# Patient Record
Sex: Female | Born: 1950 | Race: White | Hispanic: No | Marital: Married | Smoking: Never smoker
Health system: Southern US, Community
[De-identification: ages and names within clinical notes are randomized; demographics above are authoritative.]

## PROBLEM LIST (undated history)

## (undated) DIAGNOSIS — I10 Essential (primary) hypertension: Secondary | ICD-10-CM

## (undated) HISTORY — PX: OTHER SURGICAL HISTORY: SHX169

---

## 2007-06-05 ENCOUNTER — Encounter (HOSPITAL_COMMUNITY): Payer: Self-pay | Admitting: Obstetrics and Gynecology

## 2007-06-05 ENCOUNTER — Ambulatory Visit (HOSPITAL_COMMUNITY): Admission: RE | Admit: 2007-06-05 | Discharge: 2007-06-05 | Payer: Self-pay | Admitting: Obstetrics and Gynecology

## 2007-06-18 ENCOUNTER — Encounter: Payer: Self-pay | Admitting: Internal Medicine

## 2007-08-04 ENCOUNTER — Ambulatory Visit: Payer: Self-pay | Admitting: Family Medicine

## 2007-08-04 ENCOUNTER — Encounter (INDEPENDENT_AMBULATORY_CARE_PROVIDER_SITE_OTHER): Payer: Self-pay | Admitting: Family Medicine

## 2007-08-04 DIAGNOSIS — N951 Menopausal and female climacteric states: Secondary | ICD-10-CM

## 2007-08-04 DIAGNOSIS — M549 Dorsalgia, unspecified: Secondary | ICD-10-CM | POA: Insufficient documentation

## 2007-08-04 DIAGNOSIS — J309 Allergic rhinitis, unspecified: Secondary | ICD-10-CM | POA: Insufficient documentation

## 2007-08-04 DIAGNOSIS — Z9889 Other specified postprocedural states: Secondary | ICD-10-CM

## 2007-08-04 LAB — CONVERTED CEMR LAB
Bilirubin Urine: NEGATIVE
Glucose, Urine, Semiquant: NEGATIVE
Ketones, urine, test strip: NEGATIVE
Protein, U semiquant: NEGATIVE
Urobilinogen, UA: NEGATIVE

## 2007-08-07 ENCOUNTER — Telehealth (INDEPENDENT_AMBULATORY_CARE_PROVIDER_SITE_OTHER): Payer: Self-pay | Admitting: *Deleted

## 2007-11-11 ENCOUNTER — Encounter: Admission: RE | Admit: 2007-11-11 | Discharge: 2007-11-11 | Payer: Self-pay | Admitting: Obstetrics and Gynecology

## 2007-11-27 ENCOUNTER — Ambulatory Visit: Payer: Self-pay | Admitting: Internal Medicine

## 2007-11-27 DIAGNOSIS — B029 Zoster without complications: Secondary | ICD-10-CM | POA: Insufficient documentation

## 2008-01-11 ENCOUNTER — Ambulatory Visit: Payer: Self-pay | Admitting: Internal Medicine

## 2008-01-11 ENCOUNTER — Telehealth (INDEPENDENT_AMBULATORY_CARE_PROVIDER_SITE_OTHER): Payer: Self-pay | Admitting: *Deleted

## 2008-01-11 DIAGNOSIS — R21 Rash and other nonspecific skin eruption: Secondary | ICD-10-CM

## 2008-01-11 DIAGNOSIS — L509 Urticaria, unspecified: Secondary | ICD-10-CM | POA: Insufficient documentation

## 2008-06-29 ENCOUNTER — Ambulatory Visit: Payer: Self-pay | Admitting: Family Medicine

## 2008-06-29 DIAGNOSIS — N390 Urinary tract infection, site not specified: Secondary | ICD-10-CM

## 2008-06-29 LAB — CONVERTED CEMR LAB
Nitrite: NEGATIVE
Urobilinogen, UA: 0.2
pH: 6.5

## 2008-06-30 ENCOUNTER — Encounter: Payer: Self-pay | Admitting: Family Medicine

## 2008-11-15 ENCOUNTER — Encounter: Admission: RE | Admit: 2008-11-15 | Discharge: 2008-11-15 | Payer: Self-pay | Admitting: Obstetrics and Gynecology

## 2009-11-16 ENCOUNTER — Encounter: Admission: RE | Admit: 2009-11-16 | Discharge: 2009-11-16 | Payer: Self-pay | Admitting: Obstetrics and Gynecology

## 2010-10-15 ENCOUNTER — Other Ambulatory Visit (HOSPITAL_COMMUNITY): Payer: Self-pay | Admitting: Obstetrics and Gynecology

## 2010-10-15 DIAGNOSIS — Z1231 Encounter for screening mammogram for malignant neoplasm of breast: Secondary | ICD-10-CM

## 2010-11-19 ENCOUNTER — Ambulatory Visit
Admission: RE | Admit: 2010-11-19 | Discharge: 2010-11-19 | Disposition: A | Discharge status: Home / Self Care | Payer: BC Managed Care – PPO | Source: Ambulatory Visit | Attending: Obstetrics and Gynecology | Admitting: Obstetrics and Gynecology

## 2010-11-19 DIAGNOSIS — Z1231 Encounter for screening mammogram for malignant neoplasm of breast: Secondary | ICD-10-CM

## 2011-01-08 NOTE — Op Note (Signed)
NAMEOHANNA, GASSERT                  ACCOUNT NO.:  [id number]   MEDICAL RECORD NO.:  [id number]          PATIENT TYPE:  AMB   LOCATION:  SDC                           FACILITY:  [facility]   PHYSICIAN:  Zelphia Cairo, MD    DATE OF BIRTH:  1950-10-09   DATE OF PROCEDURE:  06/05/2007  DATE OF DISCHARGE:                               OPERATIVE REPORT   PREOPERATIVE DIAGNOSIS:  Endometrial polyp.   POSTOPERATIVE DIAGNOSIS:  Endometrial polyp.   OPERATION/PROCEDURE:  1. Cervical block.  2. Hysteroscopy.  3. Polypectomy   SURGEON:  Zelphia Cairo, M.D.   ANESTHESIA:  General.   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   SPECIMEN:  Endometrial polyp to pathology.   FLUIDS DEFICIT:  Fluid deficit of 50 mL.   ESTIMATED BLOOD LOSS:  Minimal.   COMPLICATIONS:  None.   CONDITION:  Stable and extubated to recovery room.   DESCRIPTION OF PROCEDURE:  The patient was taken to the operating room  where general anesthesia was obtained.  She was placed in the dorsal  lithotomy position using Allen stirrups.  She was prepped and draped in  sterile fashion.  An in-and-out catheter was used to drain her bladder  for approximately 15 mL of clear urine.  A bivalve speculum was then  placed in the vagina and a single-tooth tenaculum on the anterior lip of  the cervix.  The hysteroscope was then inserted through the cervix under  direct visualization.  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  Endometrial polyp was noted near the ostia of the right horn.  Polyp forceps were used to grasp and remove the endometrial polyp at the  base.  This was sent to pathology.  The hysteroscope was then removed.  Single-tooth tenaculum was removed from the cervix.  Cervical block was  provided with 1% lidocaine.  Speculum was removed and the patient was  extubated, taken to recovery room in stable  condition,      Zelphia Cairo, MD  Electronically Signed     GA/MEDQ  D:  06/05/2007  T:  06/06/2007  Job:  [id number]

## 2011-01-08 NOTE — H&P (Signed)
NAME:  Virginia [name], Virginia [name]                  ACCOUNT NO.:  [id number]   MEDICAL RECORD NO.:  [id number]          PATIENT TYPE:  AMB   LOCATION:                                FACILITY:  [facility]   PHYSICIAN:  Zelphia Cairo, MD    DATE OF BIRTH:  03-20-1951   DATE OF ADMISSION:  DATE OF DISCHARGE:                              HISTORY & PHYSICAL   A 55-year white female who presented in referral for abnormal Pap smear.  Pap smear done April 12, 2007 showed endometrial cells with no mention  of atypia or dysplasia.  The patient denies any symptoms of  postmenopausal bleeding or pelvic pain.   PAST MEDICAL HISTORY:  Arthritis, seasonal allergies.   SURGICAL HISTORY:  Benign vulvar biopsy 2001.   SOCIAL HISTORY:  Negative for tobacco use.   ALLERGIES:  SULFA.   MEDICATIONS:  Prempro 0.3 mg per day.   OBSTETRIC HISTORY:  Two spontaneous vaginal deliveries.   GYNECOLOGICAL HISTORY:  Negative for abnormal Pap smears.  Her last  menstrual period was 11 years ago.  She denies a history of any  postmenopausal bleeding.   FAMILY HISTORY:  Significant for father with skin cancer; otherwise,  noncontributory.   PHYSICAL EXAMINATION:  VITAL SIGNS:  Height 5 feet 4 inches, weight 184,  blood pressure 120/80.  UA negative.  HEAD AND NECK:  Normal.  No thyromegaly or nodularity.  HEART:  Regular rate and rhythm.  LUNGS:  Clear bilaterally.  ABDOMEN:  Soft, nontender, nondistended.  PELVIC:  Shows normal external female genitalia, __________ vagina and  cervix are normal.  No lesions.  Endometrial biopsy was performed  showing benign endometrial polyp and proliferative endometrium.   ASSESSMENT:  A 60 year old white female with endometrial polyp.   PLAN:  For hysteroscopy, D&C with resection of endometrial polyp.      Zelphia Cairo, MD  Electronically Signed     GA/MEDQ  D:  06/04/2007  T:  06/05/2007  Job:  [id number]

## 2011-06-06 LAB — CBC
Hemoglobin: 15.1 — ABNORMAL HIGH
MCV: 91.1
RDW: 12.7

## 2015-03-02 ENCOUNTER — Other Ambulatory Visit: Payer: Self-pay | Admitting: Obstetrics and Gynecology

## 2015-03-03 LAB — CYTOLOGY - PAP

## 2018-07-03 DIAGNOSIS — R69 Illness, unspecified: Secondary | ICD-10-CM | POA: Diagnosis not present

## 2018-08-24 DIAGNOSIS — Z6830 Body mass index (BMI) 30.0-30.9, adult: Secondary | ICD-10-CM | POA: Diagnosis not present

## 2018-08-24 DIAGNOSIS — R419 Unspecified symptoms and signs involving cognitive functions and awareness: Secondary | ICD-10-CM | POA: Diagnosis not present

## 2019-01-07 DIAGNOSIS — R69 Illness, unspecified: Secondary | ICD-10-CM | POA: Diagnosis not present

## 2019-01-11 DIAGNOSIS — R69 Illness, unspecified: Secondary | ICD-10-CM | POA: Diagnosis not present

## 2019-02-16 DIAGNOSIS — D224 Melanocytic nevi of scalp and neck: Secondary | ICD-10-CM | POA: Diagnosis not present

## 2019-02-16 DIAGNOSIS — B078 Other viral warts: Secondary | ICD-10-CM | POA: Diagnosis not present

## 2019-02-16 DIAGNOSIS — B079 Viral wart, unspecified: Secondary | ICD-10-CM | POA: Diagnosis not present

## 2019-04-16 DIAGNOSIS — R69 Illness, unspecified: Secondary | ICD-10-CM | POA: Diagnosis not present

## 2019-05-04 DIAGNOSIS — R69 Illness, unspecified: Secondary | ICD-10-CM | POA: Diagnosis not present

## 2019-07-13 DIAGNOSIS — R69 Illness, unspecified: Secondary | ICD-10-CM | POA: Diagnosis not present

## 2019-07-15 DIAGNOSIS — Z1231 Encounter for screening mammogram for malignant neoplasm of breast: Secondary | ICD-10-CM | POA: Diagnosis not present

## 2019-07-15 DIAGNOSIS — Z01419 Encounter for gynecological examination (general) (routine) without abnormal findings: Secondary | ICD-10-CM | POA: Diagnosis not present

## 2019-07-15 DIAGNOSIS — Z6829 Body mass index (BMI) 29.0-29.9, adult: Secondary | ICD-10-CM | POA: Diagnosis not present

## 2019-09-23 ENCOUNTER — Ambulatory Visit: Payer: Self-pay

## 2019-09-30 ENCOUNTER — Ambulatory Visit: Payer: Medicare HMO | Attending: Internal Medicine

## 2019-09-30 DIAGNOSIS — Z23 Encounter for immunization: Secondary | ICD-10-CM | POA: Insufficient documentation

## 2019-09-30 NOTE — Progress Notes (Signed)
   Covid-19 Vaccination Clinic  Name:  Virginia [name]    MRN: [id number] DOB: 11-21-1950  09/30/2019  [name] was observed post Covid-19 immunization for 15 minutes without incidence. She was provided with Vaccine Information Sheet and instruction to access the V-Safe system.   [name] was instructed to call 911 with any severe reactions post vaccine: Marland Kitchen Difficulty breathing  . Swelling of your face and throat  . A fast heartbeat  . A bad rash all over your body  . Dizziness and weakness    Immunizations Administered    Name Date Dose VIS Date Route   Pfizer COVID-19 Vaccine 09/30/2019 11:39 AM 0.3 mL 08/06/2019 Intramuscular   Manufacturer: [place]   Lot: [id number]   [place]: [id number]

## 2019-10-14 ENCOUNTER — Ambulatory Visit: Payer: Self-pay

## 2019-10-25 ENCOUNTER — Ambulatory Visit: Payer: Medicare HMO | Attending: Internal Medicine

## 2019-10-25 DIAGNOSIS — Z23 Encounter for immunization: Secondary | ICD-10-CM | POA: Insufficient documentation

## 2019-10-25 NOTE — Progress Notes (Signed)
   Covid-19 Vaccination Clinic  Name:  Virginia [name]    MRN: [id number] DOB: [birth year]  10/25/2019  Virginia [name] was observed post Covid-19 immunization for 15 minutes without incidence. She was provided with Vaccine Information Sheet and instruction to access the V-Safe system.   Virginia [name] was instructed to call 911 with any severe reactions post vaccine: Marland Kitchen Difficulty breathing  . Swelling of your face and throat  . A fast heartbeat  . A bad rash all over your body  . Dizziness and weakness    Immunizations Administered    Name Date Dose VIS Date Route   Pfizer COVID-19 Vaccine 10/25/2019  3:18 PM 0.3 mL 08/06/2019 Intramuscular   Manufacturer: [place]   Lot: [id number]   [place]: [id number]

## 2019-11-10 DIAGNOSIS — R69 Illness, unspecified: Secondary | ICD-10-CM | POA: Diagnosis not present

## 2019-11-25 DIAGNOSIS — R69 Illness, unspecified: Secondary | ICD-10-CM | POA: Diagnosis not present

## 2019-12-22 DIAGNOSIS — Z961 Presence of intraocular lens: Secondary | ICD-10-CM | POA: Diagnosis not present

## 2019-12-22 DIAGNOSIS — H43813 Vitreous degeneration, bilateral: Secondary | ICD-10-CM | POA: Diagnosis not present

## 2019-12-22 DIAGNOSIS — H1045 Other chronic allergic conjunctivitis: Secondary | ICD-10-CM | POA: Diagnosis not present

## 2019-12-22 DIAGNOSIS — H04123 Dry eye syndrome of bilateral lacrimal glands: Secondary | ICD-10-CM | POA: Diagnosis not present

## 2020-05-11 DIAGNOSIS — R69 Illness, unspecified: Secondary | ICD-10-CM | POA: Diagnosis not present

## 2020-07-18 DIAGNOSIS — R69 Illness, unspecified: Secondary | ICD-10-CM | POA: Diagnosis not present

## 2020-08-09 DIAGNOSIS — Z124 Encounter for screening for malignant neoplasm of cervix: Secondary | ICD-10-CM | POA: Diagnosis not present

## 2020-08-09 DIAGNOSIS — Z1231 Encounter for screening mammogram for malignant neoplasm of breast: Secondary | ICD-10-CM | POA: Diagnosis not present

## 2020-08-09 DIAGNOSIS — N952 Postmenopausal atrophic vaginitis: Secondary | ICD-10-CM | POA: Diagnosis not present

## 2020-08-09 DIAGNOSIS — Z6829 Body mass index (BMI) 29.0-29.9, adult: Secondary | ICD-10-CM | POA: Diagnosis not present

## 2020-09-28 DIAGNOSIS — N390 Urinary tract infection, site not specified: Secondary | ICD-10-CM | POA: Diagnosis not present

## 2020-09-28 DIAGNOSIS — R309 Painful micturition, unspecified: Secondary | ICD-10-CM | POA: Diagnosis not present

## 2021-04-24 DIAGNOSIS — H6980 Other specified disorders of Eustachian tube, unspecified ear: Secondary | ICD-10-CM | POA: Diagnosis not present

## 2021-04-24 DIAGNOSIS — Z713 Dietary counseling and surveillance: Secondary | ICD-10-CM | POA: Diagnosis not present

## 2021-04-24 DIAGNOSIS — I10 Essential (primary) hypertension: Secondary | ICD-10-CM | POA: Diagnosis not present

## 2021-04-24 DIAGNOSIS — R300 Dysuria: Secondary | ICD-10-CM | POA: Diagnosis not present

## 2021-06-22 ENCOUNTER — Emergency Department (HOSPITAL_BASED_OUTPATIENT_CLINIC_OR_DEPARTMENT_OTHER): Payer: Medicare HMO

## 2021-06-22 ENCOUNTER — Emergency Department (HOSPITAL_BASED_OUTPATIENT_CLINIC_OR_DEPARTMENT_OTHER)
Admission: EM | Admit: 2021-06-22 | Discharge: 2021-06-22 | Disposition: A | Discharge status: Home / Self Care | Payer: Medicare HMO | Attending: Emergency Medicine | Admitting: Emergency Medicine

## 2021-06-22 ENCOUNTER — Encounter (HOSPITAL_BASED_OUTPATIENT_CLINIC_OR_DEPARTMENT_OTHER): Payer: Self-pay

## 2021-06-22 ENCOUNTER — Other Ambulatory Visit: Payer: Self-pay

## 2021-06-22 ENCOUNTER — Emergency Department (HOSPITAL_BASED_OUTPATIENT_CLINIC_OR_DEPARTMENT_OTHER): Payer: Medicare HMO | Admitting: Radiology

## 2021-06-22 DIAGNOSIS — R079 Chest pain, unspecified: Secondary | ICD-10-CM | POA: Diagnosis not present

## 2021-06-22 DIAGNOSIS — Z79899 Other long term (current) drug therapy: Secondary | ICD-10-CM | POA: Insufficient documentation

## 2021-06-22 DIAGNOSIS — I10 Essential (primary) hypertension: Secondary | ICD-10-CM | POA: Insufficient documentation

## 2021-06-22 DIAGNOSIS — R0789 Other chest pain: Secondary | ICD-10-CM | POA: Diagnosis not present

## 2021-06-22 DIAGNOSIS — R002 Palpitations: Secondary | ICD-10-CM | POA: Diagnosis not present

## 2021-06-22 DIAGNOSIS — R0602 Shortness of breath: Secondary | ICD-10-CM | POA: Diagnosis not present

## 2021-06-22 DIAGNOSIS — R55 Syncope and collapse: Secondary | ICD-10-CM | POA: Diagnosis not present

## 2021-06-22 DIAGNOSIS — R072 Precordial pain: Secondary | ICD-10-CM | POA: Diagnosis not present

## 2021-06-22 HISTORY — DX: Essential (primary) hypertension: I10

## 2021-06-22 LAB — CBC
HCT: 48.6 % — ABNORMAL HIGH (ref 36.0–46.0)
Hemoglobin: 16.4 g/dL — ABNORMAL HIGH (ref 12.0–15.0)
MCH: 31.5 pg (ref 26.0–34.0)
MCHC: 33.7 g/dL (ref 30.0–36.0)
MCV: 93.3 fL (ref 80.0–100.0)
Platelets: 343 10*3/uL (ref 150–400)
RBC: 5.21 MIL/uL — ABNORMAL HIGH (ref 3.87–5.11)
RDW: 12.5 % (ref 11.5–15.5)
WBC: 12.4 10*3/uL — ABNORMAL HIGH (ref 4.0–10.5)
nRBC: 0 % (ref 0.0–0.2)

## 2021-06-22 LAB — BASIC METABOLIC PANEL
Anion gap: 10 (ref 5–15)
BUN: 14 mg/dL (ref 8–23)
CO2: 25 mmol/L (ref 22–32)
Calcium: 9.7 mg/dL (ref 8.9–10.3)
Chloride: 104 mmol/L (ref 98–111)
Creatinine, Ser: 0.92 mg/dL (ref 0.44–1.00)
GFR, Estimated: >60 mL/min (ref >60)
Glucose, Bld: 101 mg/dL — ABNORMAL HIGH (ref 70–99)
Potassium: 3.9 mmol/L (ref 3.5–5.1)
Sodium: 139 mmol/L (ref 135–145)

## 2021-06-22 LAB — TROPONIN I (HIGH SENSITIVITY)
Troponin I (High Sensitivity): 7 ng/L (ref <18)
Troponin I (High Sensitivity): 11 ng/L (ref <18)

## 2021-06-22 LAB — URINALYSIS, ROUTINE W REFLEX MICROSCOPIC
Bilirubin Urine: NEGATIVE
Glucose, UA: NEGATIVE mg/dL
Ketones, ur: 15 mg/dL — AB
Nitrite: NEGATIVE
Protein, ur: NEGATIVE mg/dL
Specific Gravity, Urine: 1.012 (ref 1.005–1.030)
pH: 5.5 (ref 5.0–8.0)

## 2021-06-22 MED ORDER — IOHEXOL 350 MG/ML SOLN
100.0000 mL | Freq: Once | INTRAVENOUS | Status: AC | PRN
  Administered 2021-06-22: 63 mL via INTRAVENOUS

## 2021-06-22 NOTE — ED Notes (Signed)
Patient verbalizes understanding of discharge instructions. Opportunity for questioning and answers were provided. Patient discharged from ED.  °

## 2021-06-22 NOTE — ED Notes (Signed)
Patient transported to X-ray 

## 2021-06-22 NOTE — ED Provider Notes (Signed)
Pt signed out by Dr. Rogene Houston pending 2nd trop and CT chest.  CT Chest:    IMPRESSION:  No evidence of acute pulmonary embolism or other acute abnormality.    2nd trop nl.    Pt feels well.  HR down to the 80s.   Pt is stable for d/c.  Return if worse.  F/u with pcp.     Isla Pence, MD 06/22/21 [phone]

## 2021-06-22 NOTE — ED Triage Notes (Signed)
Pt reports acute onset of feeling "like I was going to pass out", heart "pressure", hot flashes, SOB, and her heart "pounding". Symptoms started approx 2 hours ago after taking a [employer] class. Pt takes [employer] class 3 times a week. Pt denies N/V

## 2021-06-22 NOTE — Discharge Instructions (Signed)
Return for any recurrent palpitations lasting 40 minutes or longer.  Return for any passing out.  Make an appointment to follow-up with cardiology for further evaluation and also make an appointment to follow back up with your primary care doctor.  Today's labs without significant abnormalities.  Cardiac markers normal.  Chest x-ray normal.  As we discussed since she had COVID in August it is possible there could be some residual inflammation causing the arrhythmia.

## 2021-06-22 NOTE — ED Provider Notes (Addendum)
[facility]ENC[facility] DEPT Provider Note   CSN: [id number] Arrival date & time: 06/22/21  1105     History Chief Complaint  Patient presents with   Weakness   Near Syncope    Virginia [name] is a 70 y.o. female.  Patient had 20 to 30-minute onset after returning from doing deep water swim class.  Which she felt fine.  Started 2 hours after that.  Patient had sudden onset of feeling of palpitations thought she was going to pass out had some heart pressure and hot flashes and some shortness of breath.  All symptoms have resolved now.  Patient is never really had anything like this happen before.  Has medical history significant for hypertension.      Past Medical History:  Diagnosis Date   Hypertension     Patient Active Problem List   Diagnosis Date Noted   UTI 06/29/2008   URTICARIA 01/11/2008   RASH-NONVESICULAR 01/11/2008   HERPES ZOSTER 11/27/2007   ALLERGIC RHINITIS 08/04/2007   POSTMENOPAUSAL STATUS 08/04/2007   BACK PAIN, ACUTE 08/04/2007   HYSTEROSCOPY, HX OF 08/04/2007    Past Surgical History:  Procedure Laterality Date   uterine polyectomy       OB History   No obstetric history on file.     History reviewed. No pertinent family history.  Social History   Tobacco Use   Smoking status: Never   Smokeless tobacco: Never  Vaping Use   Vaping Use: Never used  Substance Use Topics   Alcohol use: Never   Drug use: Never    Home Medications Prior to Admission medications   Medication Sig Start Date End Date Taking? Authorizing Provider  hydrochlorothiazide (HYDRODIURIL) 12.5 MG tablet Take 12.5 mg by mouth as needed (Every other Day).   Yes [provider]    Allergies    Sulfonamide derivatives  Review of Systems   Review of Systems  Constitutional:  Negative for chills and fever.  HENT:  Negative for ear pain and sore throat.   Eyes:  Negative for pain and visual disturbance.  Respiratory:  Positive for shortness of  breath. Negative for cough.   Cardiovascular:  Positive for chest pain and palpitations.  Gastrointestinal:  Negative for abdominal pain and vomiting.  Genitourinary:  Negative for dysuria and hematuria.  Musculoskeletal:  Negative for arthralgias and back pain.  Skin:  Negative for color change and rash.  Neurological:  Positive for light-headedness. Negative for seizures and syncope.  All other systems reviewed and are negative.  Physical Exam Updated Vital Signs BP 109/78 (BP Location: Left Arm)   Pulse (!) 106   Temp 97.6 F (36.4 C)   Resp 18   Ht 1.638 m (5' 4.5")   Wt 76.7 kg   SpO2 99%   BMI 28.56 kg/m   Physical Exam Vitals and nursing note reviewed.  Constitutional:      General: She is not in acute distress.    Appearance: Normal appearance. She is well-developed.  HENT:     Head: Normocephalic and atraumatic.  Eyes:     Extraocular Movements: Extraocular movements intact.     Conjunctiva/sclera: Conjunctivae normal.     Pupils: Pupils are equal, round, and reactive to light.  Cardiovascular:     Rate and Rhythm: Normal rate and regular rhythm.     Heart sounds: No murmur heard. Pulmonary:     Effort: Pulmonary effort is normal. No respiratory distress.     Breath sounds: Normal breath sounds.  No wheezing.  Abdominal:     Palpations: Abdomen is soft.     Tenderness: There is no abdominal tenderness.  Musculoskeletal:        General: No swelling. Normal range of motion.     Cervical back: Neck supple.  Skin:    General: Skin is warm and dry.  Neurological:     General: No focal deficit present.     Mental Status: She is alert and oriented to person, place, and time.    ED Results / Procedures / Treatments   Labs (all labs ordered are listed, but only abnormal results are displayed) Labs Reviewed  BASIC METABOLIC PANEL - Abnormal; Notable for the following components:      Result Value   Glucose, Bld 101 (*)    All other components within normal  limits  CBC - Abnormal; Notable for the following components:   WBC 12.4 (*)    RBC 5.21 (*)    Hemoglobin 16.4 (*)    HCT 48.6 (*)    All other components within normal limits  URINALYSIS, ROUTINE W REFLEX MICROSCOPIC - Abnormal; Notable for the following components:   APPearance HAZY (*)    Hgb urine dipstick MODERATE (*)    Ketones, ur 15 (*)    Leukocytes,Ua TRACE (*)    All other components within normal limits  CBG MONITORING, ED  TROPONIN I (HIGH SENSITIVITY)  TROPONIN I (HIGH SENSITIVITY)    EKG EKG Interpretation  Date/Time:  Friday June 22 2021 11:35:23 EDT Ventricular Rate:  137 PR Interval:  140 QRS Duration: 68 QT Interval:  276 QTC Calculation: 416 R Axis:   45 Text Interpretation: Sinus tachycardia Anterior infarct , age undetermined Abnormal ECG No previous ECGs available Confirmed by Fredia Sorrow [phone]) on 06/22/2021 1:20:06 PM  Radiology DG Chest 2 View  Result Date: 06/22/2021 CLINICAL DATA:  Chest pain EXAM: CHEST - 2 VIEW COMPARISON:  None. FINDINGS: The heart size and mediastinal contours are within normal limits. Both lungs are clear. The visualized skeletal structures are unremarkable. IMPRESSION: No active cardiopulmonary disease. Electronically Signed   By: Keane Police D.O.   On: 06/22/2021 12:32    Procedures Procedures   Medications Ordered in ED Medications - No data to display  ED Course  I have reviewed the triage vital signs and the nursing notes.  Pertinent labs & imaging results that were available during my care of the patient were reviewed by me and considered in my medical decision making (see chart for details).    MDM Rules/Calculators/A&P                          Patient has been persistently tachycardic here around 100.  Oxygen sats have been 97% respiration not up.  Patient without fever.  Patient did have [place] in August.  Raising some concerns may be for pulmonary embolus.  Cardiac monitoring here without  arrhythmia.  EKG just sort of a sinus tach.  Chest x-ray negative.  Basic metabolic panel normal.  First troponin normal at 7.  Mild leukocytosis with white blood cell count 12.4.  Hemoglobin a little concentrated at 16.4.  Urinalysis not consistent with urinary tract infection.  Chest x-ray negative.  Based on patient's symptoms.  Since we are not seeing anything abnormal second troponin is pending.  We will go ahead and do CT angio chest to rule out pulmonary embolus.  Patient does not have a dye allergy  CT  angio chest negative.  The patient will follow-up for the palpitations and near syncope.  Would recommend cardiology follow-up as well as primary care doctor follow-up.  Second heart marker without significant change.  It was 11.  First 1 was 7.  CT angio chest pending.  Final Clinical Impression(s) / ED Diagnoses Final diagnoses:  Palpitations  Near syncope  Precordial pain    Rx / DC Orders ED Discharge Orders     None        Fredia Sorrow, MD 06/22/21 1428    Fredia Sorrow, MD 06/22/21 1453    Fredia Sorrow, MD 06/22/21 1454

## 2021-06-28 DIAGNOSIS — I10 Essential (primary) hypertension: Secondary | ICD-10-CM | POA: Diagnosis not present

## 2021-06-28 DIAGNOSIS — R002 Palpitations: Secondary | ICD-10-CM | POA: Diagnosis not present

## 2021-07-04 ENCOUNTER — Encounter: Payer: Self-pay | Admitting: Cardiovascular Disease

## 2021-07-04 ENCOUNTER — Ambulatory Visit (INDEPENDENT_AMBULATORY_CARE_PROVIDER_SITE_OTHER): Payer: Medicare HMO

## 2021-07-04 ENCOUNTER — Other Ambulatory Visit: Payer: Self-pay

## 2021-07-04 ENCOUNTER — Ambulatory Visit: Payer: Medicare HMO | Admitting: Cardiovascular Disease

## 2021-07-04 VITALS — BP 158/89 | HR 89 | Ht 64.0 in | Wt 169.0 lb

## 2021-07-04 DIAGNOSIS — R42 Dizziness and giddiness: Secondary | ICD-10-CM

## 2021-07-04 DIAGNOSIS — R002 Palpitations: Secondary | ICD-10-CM | POA: Diagnosis not present

## 2021-07-04 DIAGNOSIS — R55 Syncope and collapse: Secondary | ICD-10-CM

## 2021-07-04 DIAGNOSIS — U071 COVID-19: Secondary | ICD-10-CM | POA: Diagnosis not present

## 2021-07-04 MED ORDER — METOPROLOL TARTRATE 25 MG PO TABS: ORAL_TABLET | ORAL | 6 refills | Status: AC

## 2021-07-04 NOTE — Progress Notes (Unsigned)
Enrolled patient for a 14 day Zio XT  monitor to be mailed to patients home  °

## 2021-07-04 NOTE — Progress Notes (Signed)
Cardiology Office Note    Date:  07/15/2021   ID:  Logan, Vegh July 23, 1951, MRN [id number]  PCP:  Hendricks Limes, MD  Cardiologist:  Shelva Majestic, MD   New cardiology evaluation, referred to the courtesy of Dr. Lorriane Shire and [facility]   History of Present Illness:  Virginia [name] is a 70 y.o. female who was recently evaluated at [facility] emergency department after experiencing a presyncopal episode shortly after doing deep water aerobics.  Mrs. Beeck denies any known prior cardiac history.  She tested positive for COVID on August 14 and had symptoms of sinus pressure, sore throat, fever and cough.  She tested negative on August 27.  She had travel to [place] in [place] from September 14 through the 29th and did well with travel and was asymptomatic.  She denies any history of exertional chest pain or change in exercise tolerance.  She exercises regularly and does deep water aerobics at least 3 days/week for 60 minutes without difficulty.  On October 28 approximately 20 to 30 minutes after returning from her swim class, she had sudden onset of a feeling of palpitations and thought she was going to pass out.  There was vague chest pressure and hot flashes with some shortness of breath.  She presented to [facility] ER and on presentation all symptoms had resolved.  She noted mild heart rate irregularity for total of 20 minutes.  During that evaluation, her blood pressure was 109/78.  Her pulse initially was 106.  She was afebrile.  Laboratory was essentially normal.  Her ECG showed sinus tachycardia with poor anterior R wave progression.  Chest x-ray did not reveal any active cardiopulmonary disease.  A CT angio of her chest was performed which did not reveal any acute pulmonary embolus or other acute abnormality.  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  She is referred for cardiology  evaluation.  Past Medical History:  Diagnosis Date   Hypertension     Past Surgical History:  Procedure Laterality Date   uterine polyectomy      Current Medications: Outpatient Medications Prior to Visit  Medication Sig Dispense Refill   cholecalciferol (VITAMIN D3) 25 MCG (1000 UNIT) tablet 1 tablet     conjugated estrogens (PREMARIN) vaginal cream USE VAGINALLY 2 TIMES A WEEK AS DIRECTED     Oyster Shell Calcium 500 MG TABS 1 tablet with meals     hydrochlorothiazide (HYDRODIURIL) 12.5 MG tablet Take 12.5 mg by mouth as needed (Every other Day).     No facility-administered medications prior to visit.     Allergies:   Sulfonamide derivatives   Social History   Socioeconomic History   Marital status: Married    Spouse name: Not on file   Number of children: Not on file   Years of education: Not on file   Highest education level: Not on file  Occupational History   Not on file  Tobacco Use   Smoking status: Never   Smokeless tobacco: Never  Vaping Use   Vaping Use: Never used  Substance and Sexual Activity   Alcohol use: Never   Drug use: Never   Sexual activity: Not on file  Other Topics Concern   Not on file  Social History Narrative   Not on file   Social Determinants of Health   Financial Resource Strain: Not on file  Food Insecurity: Not  on file  Transportation Needs: Not on file  Physical Activity: Not on file  Stress: Not on file  Social Connections: Not on file    Socially she was born in [place].  She is married for 45 years and has 2 children and 2 great-grandchildren.  There is no history of tobacco use and she does not drink alcohol.  She exercises regularly.  Family History: Her mother died at age 45 and had a stroke which reportedly was misdiagnosed as a brain tumor.  Her father died at age 81 with hypertension and a stroke.  She has 1 brother with mitral valve prolapse and has CAD status post stenting.  ROS General: Negative; No  fevers, chills, or night sweats;  HEENT: Negative; No changes in vision or hearing, sinus congestion, difficulty swallowing Pulmonary: Negative; No cough, wheezing, shortness of breath, hemoptysis Cardiovascular: Negative; No chest pain, presyncope, syncope, palpitations GI: Negative; No nausea, vomiting, diarrhea, or abdominal pain GU: Negative; No dysuria, hematuria, or difficulty voiding Musculoskeletal: Negative; no myalgias, joint pain, or weakness Hematologic/Oncology: Negative; no easy bruising, bleeding Endocrine: Negative; no heat/cold intolerance; no diabetes Neuro: Negative; no changes in balance, headaches Skin: Negative; No rashes or skin lesions Psychiatric: Negative; No behavioral problems, depression Sleep: Negative; No snoring, daytime sleepiness, hypersomnolence, bruxism, restless legs, hypnogognic hallucinations, no cataplexy Other comprehensive 14 point system review is negative.   PHYSICAL EXAM:   VS:  BP (!) 158/89 (BP Location: Left Arm)   Pulse 89   Ht 5' 4"  (1.626 m)   Wt 169 lb (76.7 kg)   SpO2 100%   BMI 29.01 kg/m     Repeat blood pressure by me was 140/78 initially and on repeat was 130/76.  Wt Readings from Last 3 Encounters:  07/04/21 169 lb (76.7 kg)  06/22/21 169 lb (76.7 kg)    General: Alert, oriented, no distress.  Skin: normal turgor, no rashes, warm and dry HEENT: Normocephalic, atraumatic. Pupils equal round and reactive to light; sclera anicteric; extraocular muscles intact;  Nose without nasal septal hypertrophy Mouth/Parynx benign; Mallinpatti scale 2 Neck: No JVD, no carotid bruits; normal carotid upstroke Lungs: clear to ausculatation and percussion; no wheezing or rales Chest wall: without tenderness to palpitation Heart: PMI not displaced, RRR, s1 s2 normal, 1/6 systolic murmur, no diastolic murmur, no rubs, gallops, thrills, or heaves Abdomen: soft, nontender; no hepatosplenomehaly, BS+; abdominal aorta nontender and not dilated  by palpation. Back: no CVA tenderness Pulses 2+ Musculoskeletal: full range of motion, normal strength, no joint deformities Extremities: no clubbing cyanosis or edema, Homan's sign negative  Neurologic: grossly nonfocal; Cranial nerves grossly wnl Psychologic: Normal mood and affect   Studies/Labs Reviewed:   EKG:  EKG is ordered today.  ECG (independently read by me): NSR at 81; no ectopy, normal intervlas  Recent Labs: BMP Latest Ref Rng & Units 07/05/2021 06/22/2021  Glucose 70 - 99 mg/dL 94 101(H)  BUN 8 - 27 mg/dL 13 14  Creatinine 0.57 - 1.00 mg/dL 0.86 0.92  BUN/Creat Ratio 12 - 28 15 -  Sodium 134 - 144 mmol/L 142 139  Potassium 3.5 - 5.2 mmol/L 4.3 3.9  Chloride 96 - 106 mmol/L 105 104  CO2 20 - 29 mmol/L 26 25  Calcium 8.7 - 10.3 mg/dL 9.4 9.7     Hepatic Function Latest Ref Rng & Units 07/05/2021  Total Protein 6.0 - 8.5 g/dL 6.5  Albumin 3.8 - 4.8 g/dL 4.1  AST 0 - 40 IU/L 14  ALT 0 -  32 IU/L 15  Alk Phosphatase 44 - 121 IU/L 105  Total Bilirubin 0.0 - 1.2 mg/dL 0.5    CBC Latest Ref Rng & Units 06/22/2021 06/05/2007  WBC 4.0 - 10.5 K/uL 12.4(H) 11.0(H)  Hemoglobin 12.0 - 15.0 g/dL 16.4(H) 15.1(H)  Hematocrit 36.0 - 46.0 % 48.6(H) 43.1  Platelets 150 - 400 K/uL 343 335   Lab Results  Component Value Date   MCV 93.3 06/22/2021   MCV 91.1 06/05/2007   No results found for: TSH No results found for: HGBA1C   BNP No results found for: BNP  ProBNP No results found for: PROBNP   Lipid Panel     Component Value Date/Time   CHOL 198 07/05/2021 0838   TRIG 94 07/05/2021 0838   HDL 58 07/05/2021 0838   CHOLHDL 3.4 07/05/2021 0838   LDLCALC 123 (H) 07/05/2021 0838   LABVLDL 17 07/05/2021 0838     RADIOLOGY: DG Chest 2 View  Result Date: 06/22/2021 CLINICAL DATA:  Chest pain EXAM: CHEST - 2 VIEW COMPARISON:  None. FINDINGS: The heart size and mediastinal contours are within normal limits. Both lungs are clear. The visualized skeletal  structures are unremarkable. IMPRESSION: No active cardiopulmonary disease. Electronically Signed   By: Keane Police D.O.   On: 06/22/2021 12:32   CT Angio Chest PE W/Cm &/Or Wo Cm  Result Date: 06/22/2021 CLINICAL DATA:  PE suspected, high prob EXAM: CT ANGIOGRAPHY CHEST WITH CONTRAST TECHNIQUE: Multidetector CT imaging of the chest was performed using the standard protocol during bolus administration of intravenous contrast. Multiplanar CT image reconstructions and MIPs were obtained to evaluate the vascular anatomy. CONTRAST:  22m OMNIPAQUE IOHEXOL 350 MG/ML SOLN COMPARISON:  None. FINDINGS: Cardiovascular: Satisfactory opacification of the pulmonary arteries to the segmental level. No evidence of pulmonary embolism. Normal heart size. No pericardial effusion. Mediastinum/Nodes: No enlarged lymph nodes. Thyroid and esophagus are unremarkable. Lungs/Pleura: No consolidation or mass. No pleural effusion or pneumothorax. Upper Abdomen: No acute abnormality. Musculoskeletal: No acute osseous abnormality. Vertebral body hemangioma at T11. Degenerative changes are present at T11-T12. Review of the MIP images confirms the above findings. IMPRESSION: No evidence of acute pulmonary embolism or other acute abnormality. Electronically Signed   By: PMacy MisM.D.   On: 06/22/2021 16:22     Additional studies/ records that were reviewed today include:  I reviewed her records from her ER evaluation.    ASSESSMENT:    1. Postural dizziness with presyncope   2. Palpitations   3. COVID: August 14 - 20, 2022     PLAN:  Ms. SOsie Merkinis a very pleasant 70year old female who denies any known cardiac history.  She tested positive for COVID in August and had symptoms of sinus pressure, sore throat, short-term fever and cough.  Following her recent deep water aerobics class, approximately 20 minutes later she noticed heart rate irregularity and a sensation of vague heart pressure and hot flashes with  associated presyncope.  Chest CT was negative for PE.  Her blood pressure is elevated today as she has noticed at times her heart rate increasing to approximately 100.  Her ECG shows sinus rhythm at 81 without abnormality.  With her recent COVID infection, I am recommending she undergo a 2D echo Doppler study for assessment of LV systolic and diastolic function.  I have also have recommended she wear a Zio patch monitor for 14 days.  I am giving her a prescription of metoprolol tartrate to take 25 mg on a  as needed basis if she does experience recurrent tachypalpitations.  I will recheck laboratory including a comprehensive metabolic panel, magnesium level, and fasting lipid panel.  I reviewed the laboratory that had been sent from the recent ER evaluation.  Her TSH level was 1.43.  I will see her in follow-up of the above studies and further recommendations will be made at that time.   Medication Adjustments/Labs and Tests Ordered: Current medicines are reviewed at length with the patient today.  Concerns regarding medicines are outlined above.  Medication changes, Labs and Tests ordered today are listed in the Patient Instructions below. Patient Instructions  Medication Instructions:  Take metoprolol tartrate 25 mg once a day as needed. *If you need a refill on your cardiac medications before your next appointment, please call your pharmacy*   Lab Work: Return for fasting blood work (CMET, Magnesium, Lpids) If you have labs (blood work) drawn today and your tests are completely normal, you will receive your results only by: [facility] (if you have MyChart) OR A paper copy in the mail If you have any lab test that is abnormal or we need to change your treatment, we will call you to review the results.   Testing/Procedures: Your physician has requested that you have an echocardiogram. Echocardiography is a painless test that uses sound waves to create images of your heart. It provides your  doctor with information about the size and shape of your heart and how well your heart's chambers and valves are working. This procedure takes approximately one hour. There are no restrictions for this procedure.  2. ZIO XT- Long Term Monitor Instructions  Your physician has requested you wear a ZIO patch monitor for 14 days.  This is a single patch monitor. [employer] supplies one patch monitor per enrollment. Additional stickers are not available. Please do not apply patch if you will be having a Nuclear Stress Test,  Echocardiogram, Cardiac CT, MRI, or Chest Xray during the period you would be wearing the  monitor. The patch cannot be worn during these tests. You cannot remove and re-apply the  ZIO XT patch monitor.  Your ZIO patch monitor will be mailed 3 day USPS to your address on file. It may take 3-5 days  to receive your monitor after you have been enrolled.  Once you have received your monitor, please review the enclosed instructions. Your monitor  has already been registered assigning a specific monitor serial # to you.  Billing and Patient Assistance Program Information  We have supplied [employer] with any of your insurance information on file for billing purposes. [employer] offers a sliding scale Patient Assistance Program for patients that do not have  insurance, or whose insurance does not completely cover the cost of the ZIO monitor.  You must apply for the Patient Assistance Program to qualify for this discounted rate.  To apply, please call [employer] at [phone], select option 4, select option 2, ask to apply for  Patient Assistance Program. Theodore Demark will ask your household income, and how many people  are in your household. They will quote your out-of-pocket cost based on that information.  [employer] will also be able to set up a 17-month interest-free payment plan if needed.  Applying the monitor   Shave hair from upper left chest.  Hold abrader disc by orange tab. Rub  abrader in 40 strokes over the upper left chest as  indicated in your monitor instructions.  Clean area with 4 enclosed alcohol pads. Let dry.  Apply patch as indicated in monitor instructions. Patch will be placed under collarbone on left  side of chest with arrow pointing upward.  Rub patch adhesive wings for 2 minutes. Remove white label marked "1". Remove the white  label marked "2". Rub patch adhesive wings for 2 additional minutes.  While looking in a mirror, press and release button in center of patch. A small green light will  flash 3-4 times. This will be your only indicator that the monitor has been turned on.  Do not shower for the first 24 hours. You may shower after the first 24 hours.  Press the button if you feel a symptom. You will hear a small click. Record Date, Time and  Symptom in the Patient Logbook.  When you are ready to remove the patch, follow instructions on the last 2 pages of Patient  Logbook. Stick patch monitor onto the last page of Patient Logbook.  Place Patient Logbook in the blue and white box. Use locking tab on box and tape box closed  securely. The blue and white box has prepaid postage on it. Please place it in the mailbox as  soon as possible. Your physician should have your test results approximately 7 days after the  monitor has been mailed back to [facility].  Call [facility] at [phone] if you have questions regarding  your ZIO XT patch monitor. Call them immediately if you see an orange light blinking on your  monitor.  If your monitor falls off in less than 4 days, contact our Monitor department at [phone].  If your monitor becomes loose or falls off after 4 days call [employer] at [phone] for  suggestions on securing your monitor     Follow-Up: At [facility], you and your health needs are our priority.  As part of our continuing mission to provide you with exceptional heart care, we have created  designated Provider Care Teams.  These Care Teams include your primary Cardiologist (physician) and Advanced Practice Providers (APPs -  Physician Assistants and Nurse Practitioners) who all work together to provide you with the care you need, when you need it.  We recommend signing up for the patient portal called "MyChart".  Sign up information is provided on this After Visit Summary.  MyChart is used to connect with patients for Virtual Visits (Telemedicine).  Patients are able to view lab/test results, encounter notes, upcoming appointments, etc.  Non-urgent messages can be sent to your provider as well.   To learn more about what you can do with MyChart, go to [email or web address].    Your next appointment:   2 month(s)  The format for your next appointment:   In Person  Provider:   Dr. Corky Downs, MD    Signed, Shelva Majestic, MD  07/15/2021 6:54 PM    [facility] [address], [place], [place], [place]  [address] Phone: [phone]

## 2021-07-04 NOTE — Patient Instructions (Signed)
Medication Instructions:  Take metoprolol tartrate 25 mg once a day as needed. *If you need a refill on your cardiac medications before your next appointment, please call your pharmacy*   Lab Work: Return for fasting blood work (CMET, Magnesium, Lpids) If you have labs (blood work) drawn today and your tests are completely normal, you will receive your results only by: [facility] (if you have MyChart) OR A paper copy in the mail If you have any lab test that is abnormal or we need to change your treatment, we will call you to review the results.   Testing/Procedures: Your physician has requested that you have an echocardiogram. Echocardiography is a painless test that uses sound waves to create images of your heart. It provides your doctor with information about the size and shape of your heart and how well your heart's chambers and valves are working. This procedure takes approximately one hour. There are no restrictions for this procedure.  2. ZIO XT- Long Term Monitor Instructions  Your physician has requested you wear a ZIO patch monitor for 14 days.  This is a single patch monitor. [employer] supplies one patch monitor per enrollment. Additional stickers are not available. Please do not apply patch if you will be having a Nuclear Stress Test,  Echocardiogram, Cardiac CT, MRI, or Chest Xray during the period you would be wearing the  monitor. The patch cannot be worn during these tests. You cannot remove and re-apply the  ZIO XT patch monitor.  Your ZIO patch monitor will be mailed 3 day USPS to your address on file. It may take 3-5 days  to receive your monitor after you have been enrolled.  Once you have received your monitor, please review the enclosed instructions. Your monitor  has already been registered assigning a specific monitor serial # to you.  Billing and Patient Assistance Program Information  We have supplied [employer] with any of your insurance information on file  for billing purposes. [employer] offers a sliding scale Patient Assistance Program for patients that do not have  insurance, or whose insurance does not completely cover the cost of the ZIO monitor.  You must apply for the Patient Assistance Program to qualify for this discounted rate.  To apply, please call [employer] at [phone], select option 4, select option 2, ask to apply for  Patient Assistance Program. Theodore Demark will ask your household income, and how many people  are in your household. They will quote your out-of-pocket cost based on that information.  [employer] will also be able to set up a 33-month, interest-free payment plan if needed.  Applying the monitor   Shave hair from upper left chest.  Hold abrader disc by orange tab. Rub abrader in 40 strokes over the upper left chest as  indicated in your monitor instructions.  Clean area with 4 enclosed alcohol pads. Let dry.  Apply patch as indicated in monitor instructions. Patch will be placed under collarbone on left  side of chest with arrow pointing upward.  Rub patch adhesive wings for 2 minutes. Remove white label marked "1". Remove the white  label marked "2". Rub patch adhesive wings for 2 additional minutes.  While looking in a mirror, press and release button in center of patch. A small green light will  flash 3-4 times. This will be your only indicator that the monitor has been turned on.  Do not shower for the first 24 hours. You may shower after the first 24 hours.  Press the button  if you feel a symptom. You will hear a small click. Record Date, Time and  Symptom in the Patient Logbook.  When you are ready to remove the patch, follow instructions on the last 2 pages of Patient  Logbook. Stick patch monitor onto the last page of Patient Logbook.  Place Patient Logbook in the blue and white box. Use locking tab on box and tape box closed  securely. The blue and white box has prepaid postage on it. Please place it in the  mailbox as  soon as possible. Your physician should have your test results approximately 7 days after the  monitor has been mailed back to [facility].  Call [facility] at [phone] if you have questions regarding  your ZIO XT patch monitor. Call them immediately if you see an orange light blinking on your  monitor.  If your monitor falls off in less than 4 days, contact our Monitor department at [phone].  If your monitor becomes loose or falls off after 4 days call [employer] at [phone] for  suggestions on securing your monitor     Follow-Up: At [facility], you and your health needs are our priority.  As part of our continuing mission to provide you with exceptional heart care, we have created designated Provider Care Teams.  These Care Teams include your primary Cardiologist (physician) and Advanced Practice Providers (APPs -  Physician Assistants and Nurse Practitioners) who all work together to provide you with the care you need, when you need it.  We recommend signing up for the patient portal called "MyChart".  Sign up information is provided on this After Visit Summary.  MyChart is used to connect with patients for Virtual Visits (Telemedicine).  Patients are able to view lab/test results, encounter notes, upcoming appointments, etc.  Non-urgent messages can be sent to your provider as well.   To learn more about what you can do with MyChart, go to [email or web address].    Your next appointment:   2 month(s)  The format for your next appointment:   In Person  Provider:   Dr. Corky Downs, MD

## 2021-07-05 DIAGNOSIS — U071 COVID-19: Secondary | ICD-10-CM | POA: Diagnosis not present

## 2021-07-05 DIAGNOSIS — R002 Palpitations: Secondary | ICD-10-CM | POA: Diagnosis not present

## 2021-07-05 DIAGNOSIS — R42 Dizziness and giddiness: Secondary | ICD-10-CM | POA: Diagnosis not present

## 2021-07-05 DIAGNOSIS — R55 Syncope and collapse: Secondary | ICD-10-CM | POA: Diagnosis not present

## 2021-07-05 LAB — COMPREHENSIVE METABOLIC PANEL
ALT: 15 IU/L (ref 0–32)
AST: 14 IU/L (ref 0–40)
Albumin/Globulin Ratio: 1.7 (ref 1.2–2.2)
Albumin: 4.1 g/dL (ref 3.8–4.8)
Alkaline Phosphatase: 105 IU/L (ref 44–121)
BUN/Creatinine Ratio: 15 (ref 12–28)
BUN: 13 mg/dL (ref 8–27)
Bilirubin Total: 0.5 mg/dL (ref 0.0–1.2)
CO2: 26 mmol/L (ref 20–29)
Calcium: 9.4 mg/dL (ref 8.7–10.3)
Chloride: 105 mmol/L (ref 96–106)
Creatinine, Ser: 0.86 mg/dL (ref 0.57–1.00)
Globulin, Total: 2.4 g/dL (ref 1.5–4.5)
Glucose: 94 mg/dL (ref 70–99)
Potassium: 4.3 mmol/L (ref 3.5–5.2)
Sodium: 142 mmol/L (ref 134–144)
Total Protein: 6.5 g/dL (ref 6.0–8.5)
eGFR: 74 mL/min/{1.73_m2} (ref >59)

## 2021-07-05 LAB — LIPID PANEL
Chol/HDL Ratio: 3.4 ratio (ref 0.0–4.4)
Cholesterol, Total: 198 mg/dL (ref 100–199)
HDL: 58 mg/dL (ref >39)
LDL Chol Calc (NIH): 123 mg/dL — ABNORMAL HIGH (ref 0–99)
Triglycerides: 94 mg/dL (ref 0–149)
VLDL Cholesterol Cal: 17 mg/dL (ref 5–40)

## 2021-07-05 LAB — MAGNESIUM: Magnesium: 1.9 mg/dL (ref 1.6–2.3)

## 2021-07-08 DIAGNOSIS — R42 Dizziness and giddiness: Secondary | ICD-10-CM

## 2021-07-08 DIAGNOSIS — R55 Syncope and collapse: Secondary | ICD-10-CM | POA: Diagnosis not present

## 2021-07-12 DIAGNOSIS — K5730 Diverticulosis of large intestine without perforation or abscess without bleeding: Secondary | ICD-10-CM | POA: Diagnosis not present

## 2021-07-12 DIAGNOSIS — K219 Gastro-esophageal reflux disease without esophagitis: Secondary | ICD-10-CM | POA: Diagnosis not present

## 2021-07-12 DIAGNOSIS — Z1211 Encounter for screening for malignant neoplasm of colon: Secondary | ICD-10-CM | POA: Diagnosis not present

## 2021-07-12 DIAGNOSIS — R002 Palpitations: Secondary | ICD-10-CM | POA: Diagnosis not present

## 2021-07-15 ENCOUNTER — Encounter: Payer: Self-pay | Admitting: Cardiovascular Disease

## 2021-07-26 ENCOUNTER — Other Ambulatory Visit: Payer: Self-pay

## 2021-07-26 ENCOUNTER — Ambulatory Visit (HOSPITAL_COMMUNITY): Payer: Medicare HMO | Attending: Cardiology

## 2021-07-26 DIAGNOSIS — R55 Syncope and collapse: Secondary | ICD-10-CM | POA: Diagnosis present

## 2021-07-26 DIAGNOSIS — R42 Dizziness and giddiness: Secondary | ICD-10-CM

## 2021-07-26 LAB — ECHOCARDIOGRAM COMPLETE
Area-P 1/2: 5.38 cm2
MV M vel: 6.03 m/s
MV Peak grad: 145.4 mmHg
Radius: 0.6 cm
S' Lateral: 3.6 cm

## 2021-07-27 DIAGNOSIS — R55 Syncope and collapse: Secondary | ICD-10-CM | POA: Diagnosis not present

## 2021-07-27 DIAGNOSIS — R42 Dizziness and giddiness: Secondary | ICD-10-CM | POA: Diagnosis not present

## 2021-08-06 DIAGNOSIS — F419 Anxiety disorder, unspecified: Secondary | ICD-10-CM | POA: Diagnosis not present

## 2021-08-06 DIAGNOSIS — R69 Illness, unspecified: Secondary | ICD-10-CM | POA: Diagnosis not present

## 2021-08-13 DIAGNOSIS — F419 Anxiety disorder, unspecified: Secondary | ICD-10-CM | POA: Diagnosis not present

## 2021-08-13 DIAGNOSIS — R69 Illness, unspecified: Secondary | ICD-10-CM | POA: Diagnosis not present

## 2021-08-24 ENCOUNTER — Telehealth: Payer: Self-pay | Admitting: *Deleted

## 2021-08-24 NOTE — Telephone Encounter (Signed)
° °  Pre-operative Risk Assessment    Patient Name: Virginia [name]  DOB: October 05, 1950 MRN: [id number]      Request for Surgical Clearance    Procedure:   colonoscopy  Date of Surgery:  Clearance 09/26/21                                 Surgeon:  Dr Meriel Pica Surgeon's Group or Practice Name:  [facility] Phone number:   [phone] Fax number:  [phone]   Type of Clearance Requested:   - Medical    Type of Anesthesia:   propofol   Additional requests/questions:  Please advise surgeon/provider what medications should be held.  Olin Pia   08/24/2021, 4:27 PM

## 2021-08-24 NOTE — Telephone Encounter (Signed)
Primary Cardiologist:Thomas Claiborne Billings, MD  Chart reviewed as part of pre-operative protocol coverage. Because of Norely Schlick Dominy's past medical history and time since last visit, he/she will require a follow-up visit in order to better assess preoperative cardiovascular risk.  Pre-op covering staff: - Patient has an appointment with Dr. Claiborne Billings on 09/17/21.  - Please contact requesting surgeon's office via preferred method (i.e, phone, fax) to inform them of need for appointment prior to surgery.  If applicable, this message will also be routed to pharmacy pool and/or primary cardiologist for input on holding anticoagulant/antiplatelet agent as requested below so that this information is available at time of patient's appointment.   Emmaline Life, NP-C    08/24/2021, 4:40 PM [facility] [address], [address] Office [phone] Fax [phone]

## 2021-08-24 NOTE — Telephone Encounter (Signed)
Pt has appt 09/17/21 with Dr. Claiborne Billings. Will forward notes to MD for upcoming appt. Will send FYI to requesting office pt has appt 09/17/21.

## 2021-08-30 DIAGNOSIS — I10 Essential (primary) hypertension: Secondary | ICD-10-CM | POA: Diagnosis not present

## 2021-08-30 DIAGNOSIS — R7989 Other specified abnormal findings of blood chemistry: Secondary | ICD-10-CM | POA: Diagnosis not present

## 2021-09-03 DIAGNOSIS — R69 Illness, unspecified: Secondary | ICD-10-CM | POA: Diagnosis not present

## 2021-09-03 DIAGNOSIS — F419 Anxiety disorder, unspecified: Secondary | ICD-10-CM | POA: Diagnosis not present

## 2021-09-06 DIAGNOSIS — I10 Essential (primary) hypertension: Secondary | ICD-10-CM | POA: Diagnosis not present

## 2021-09-06 DIAGNOSIS — Z0000 Encounter for general adult medical examination without abnormal findings: Secondary | ICD-10-CM | POA: Diagnosis not present

## 2021-09-06 DIAGNOSIS — Z1339 Encounter for screening examination for other mental health and behavioral disorders: Secondary | ICD-10-CM | POA: Diagnosis not present

## 2021-09-06 DIAGNOSIS — E785 Hyperlipidemia, unspecified: Secondary | ICD-10-CM | POA: Diagnosis not present

## 2021-09-06 DIAGNOSIS — I471 Supraventricular tachycardia: Secondary | ICD-10-CM | POA: Diagnosis not present

## 2021-09-06 DIAGNOSIS — Z1331 Encounter for screening for depression: Secondary | ICD-10-CM | POA: Diagnosis not present

## 2021-09-17 ENCOUNTER — Other Ambulatory Visit: Payer: Self-pay

## 2021-09-17 ENCOUNTER — Ambulatory Visit: Payer: Medicare HMO | Admitting: Cardiovascular Disease

## 2021-09-17 ENCOUNTER — Encounter: Payer: Self-pay | Admitting: Cardiovascular Disease

## 2021-09-17 DIAGNOSIS — Z8249 Family history of ischemic heart disease and other diseases of the circulatory system: Secondary | ICD-10-CM | POA: Diagnosis not present

## 2021-09-17 DIAGNOSIS — R69 Illness, unspecified: Secondary | ICD-10-CM | POA: Diagnosis not present

## 2021-09-17 DIAGNOSIS — Z823 Family history of stroke: Secondary | ICD-10-CM | POA: Diagnosis not present

## 2021-09-17 DIAGNOSIS — R002 Palpitations: Secondary | ICD-10-CM | POA: Diagnosis not present

## 2021-09-17 DIAGNOSIS — F419 Anxiety disorder, unspecified: Secondary | ICD-10-CM | POA: Diagnosis not present

## 2021-09-17 DIAGNOSIS — I471 Supraventricular tachycardia: Secondary | ICD-10-CM | POA: Diagnosis not present

## 2021-09-17 DIAGNOSIS — U071 COVID-19: Secondary | ICD-10-CM | POA: Diagnosis not present

## 2021-09-17 DIAGNOSIS — E7800 Pure hypercholesterolemia, unspecified: Secondary | ICD-10-CM | POA: Diagnosis not present

## 2021-09-17 MED ORDER — METOPROLOL SUCCINATE ER 25 MG PO TB24: 25.0000 mg | ORAL_TABLET | Freq: Every day | ORAL | 2 refills | Status: DC

## 2021-09-17 NOTE — Progress Notes (Signed)
Cardiology Office Note    Date:  09/29/2021   ID:  Virginia [name], Virginia [name] 02/09/1951, MRN [id number]  PCP:  Michael Boston, MD  Cardiologist:  Shelva Majestic, MD   F/U cardiology evaluation, initially referred to the courtesy of Dr. Lorriane Shire and [facility]   History of Present Illness:  Virginia [name] is a 71 y.o. female who was recently evaluated at [facility] emergency department after experiencing a presyncopal episode shortly after doing deep water aerobics.  I saw Virginia [name] for her new cardiology evaluation on July 04, 2021. Virginia [name] denies any known prior cardiac history.  She tested positive for COVID on August 14 and had symptoms of sinus pressure, sore throat, fever and cough.  She tested negative on August 27.  She had travel to [place] in [place] from September 14 through the 29th and did well with travel and was asymptomatic.  She denies any history of exertional chest pain or change in exercise tolerance.  She exercises regularly and does deep water aerobics at least 3 days/week for 60 minutes without difficulty.  On October 28 approximately 20 to 30 minutes after returning from her swim class, she had sudden onset of a feeling of palpitations and thought she was going to pass out.  There was vague chest pressure and hot flashes with some shortness of breath.  She presented to [facility] ER and on presentation all symptoms had resolved.  She noted mild heart rate irregularity for total of 20 minutes.  During that evaluation, her blood pressure was 109/78.  Her pulse initially was 106.  She was afebrile.  Laboratory was essentially normal.  Her ECG showed sinus tachycardia with poor anterior R wave progression.  Chest x-ray did not reveal any active cardiopulmonary disease.  A CT angio of her chest was performed which did not reveal any acute pulmonary embolus or other acute abnormality.  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  During that evaluation her ECG showed sinus rhythm at 81 without abnormality.  With her recent COVID infection I recommended a 2D echo Doppler study for assessment of LV systolic and diastolic function.  I also recommended that she wear a Zio patch monitor for 14 days.  Repeat laboratory was recommended.  Virginia [name] underwent echo Doppler study on July 26, 2021 which showed an EF of 55 to 60%.  She had grade 1 diastolic dysfunction.  There was evidence for moderate posterior directed mitral regurgitation and it was felt that the mechanism of the MR was due to tethering of the posterior leaflet.  She wore a 2-week Zio patch monitor from November 13 through July 22, 2021.  The predominant rhythm was sinus rhythm with an average rate at 80 bpm.  The slowest rate was 54 bpm which occurred at 10:58 PM and fastest was sinus tachycardia which occurred at 7:49 AM.  There was atrial fibrillation with a 1% burden.  Longest interval lasted 3 hours and 29 minutes with an average rate at 132.  There were short-lived episodes of SVT with possible atrial tachycardia with variable block.  The fastest burst of SVT lasted 9 beats.  There were occasional PACs with rare couplets, rare atrial triplets and rare ventricular ectopy with an episode of ventricular trigeminy.  Presently, she has felt well.  At her initial evaluation she was given a prescription for metoprolol tartrate 25 mg to take  as needed. Following her Holter monitor, she had taken a dose of metoprolol to tartrate due to short sensations of heart pain getting on November 26, November 27, December 1, December 3, December 4, December 5, and on December 14.  She has seen her primary provider Dr. Dionne Milo while on September 06, 2021 and was stable.  She has noticed some mild chest wall soreness which is musculoskeletal.  There is no exertional precipitation.  She is scheduled to undergo colonoscopy by Dr. Collene Mares recent laboratory has shown an LDL  cholesterol at 123.  She had normal renal function.  She presents to the office today with her husband for evaluation.  Past Medical History:  Diagnosis Date   Hypertension     Past Surgical History:  Procedure Laterality Date   uterine polyectomy      Current Medications: Outpatient Medications Prior to Visit  Medication Sig Dispense Refill   cholecalciferol (VITAMIN D3) 25 MCG (1000 UNIT) tablet 1 tablet     conjugated estrogens (PREMARIN) vaginal cream USE VAGINALLY 2 TIMES A WEEK AS DIRECTED     FLUZONE HIGH-DOSE QUADRIVALENT 0.7 ML SUSY      metoprolol tartrate (LOPRESSOR) 25 MG tablet Take one tablet daily as needed. 30 tablet 6   Oyster Shell Calcium 500 MG TABS 1 tablet with meals     PFIZER COVID-19 VAC BIVALENT injection      No facility-administered medications prior to visit.     Allergies:   Sulfonamide derivatives   Social History   Socioeconomic History   Marital status: Married    Spouse name: Not on file   Number of children: Not on file   Years of education: Not on file   Highest education level: Not on file  Occupational History   Not on file  Tobacco Use   Smoking status: Never   Smokeless tobacco: Never  Vaping Use   Vaping Use: Never used  Substance and Sexual Activity   Alcohol use: Never   Drug use: Never   Sexual activity: Not on file  Other Topics Concern   Not on file  Social History Narrative   Not on file   Social Determinants of Health   Financial Resource Strain: Not on file  Food Insecurity: Not on file  Transportation Needs: Not on file  Physical Activity: Not on file  Stress: Not on file  Social Connections: Not on file    Socially she was born in [place].  She is married for 45 years and has 2 children and 2 great-grandchildren.  There is no history of tobacco use and she does not drink alcohol.  She exercises regularly.  Family History: Her mother died at age 68 and had a stroke which reportedly was misdiagnosed as  a brain tumor.  Her father died at age 55 with hypertension and a stroke.  She has 1 brother with mitral valve prolapse and has CAD status post stenting.  ROS General: Negative; No fevers, chills, or night sweats;  HEENT: Negative; No changes in vision or hearing, sinus congestion, difficulty swallowing Pulmonary: Negative; No cough, wheezing, shortness of breath, hemoptysis Cardiovascular: See HPI GI: Negative; No nausea, vomiting, diarrhea, or abdominal pain GU: Negative; No dysuria, hematuria, or difficulty voiding Musculoskeletal: Negative; no myalgias, joint pain, or weakness Hematologic/Oncology: Negative; no easy bruising, bleeding Endocrine: Negative; no heat/cold intolerance; no diabetes Neuro: Negative; no changes in balance, headaches Skin: Negative; No rashes or skin lesions Psychiatric: Negative; No behavioral problems, depression Sleep: Negative; No  snoring, daytime sleepiness, hypersomnolence, bruxism, restless legs, hypnogognic hallucinations, no cataplexy Other comprehensive 14 point system review is negative.   PHYSICAL EXAM:   VS:  BP (!) 146/82    Pulse 98    Ht 5' 4.5" (1.638 m)    Wt 178 lb 12.8 oz (81.1 kg)    SpO2 98%    BMI 30.22 kg/m     Repeat blood pressure by me was 134/80  Wt Readings from Last 3 Encounters:  09/17/21 178 lb 12.8 oz (81.1 kg)  07/04/21 169 lb (76.7 kg)  06/22/21 169 lb (76.7 kg)    General: Alert, oriented, no distress.  Skin: normal turgor, no rashes, warm and dry HEENT: Normocephalic, atraumatic. Pupils equal round and reactive to light; sclera anicteric; extraocular muscles intact;  Nose without nasal septal hypertrophy Mouth/Parynx benign; Mallinpatti scale 2 Neck: No JVD, no carotid bruits; normal carotid upstroke Lungs: clear to ausculatation and percussion; no wheezing or rales Chest wall: without tenderness to palpitation Heart: PMI not displaced, RRR, s1 s2 normal, 1/6 systolic murmur, no diastolic murmur, no rubs, gallops,  thrills, or heaves Abdomen: soft, nontender; no hepatosplenomehaly, BS+; abdominal aorta nontender and not dilated by palpation. Back: no CVA tenderness Pulses 2+ Musculoskeletal: full range of motion, normal strength, no joint deformities Extremities: no clubbing cyanosis or edema, Homan's sign negative  Neurologic: grossly nonfocal; Cranial nerves grossly wnl Psychologic: Normal mood and affect   Studies/Labs Reviewed:   September 17, 2021 ECG (independently read by me): NSR at 98, no ectopy, normal intervals  November (, 2022 ECG (independently read by me): NSR at 81; no ectopy, normal intervlas  Recent Labs: BMP Latest Ref Rng & Units 07/05/2021 06/22/2021  Glucose 70 - 99 mg/dL 94 101(H)  BUN 8 - 27 mg/dL 13 14  Creatinine 0.57 - 1.00 mg/dL 0.86 0.92  BUN/Creat Ratio 12 - 28 15 -  Sodium 134 - 144 mmol/L 142 139  Potassium 3.5 - 5.2 mmol/L 4.3 3.9  Chloride 96 - 106 mmol/L 105 104  CO2 20 - 29 mmol/L 26 25  Calcium 8.7 - 10.3 mg/dL 9.4 9.7     Hepatic Function Latest Ref Rng & Units 07/05/2021  Total Protein 6.0 - 8.5 g/dL 6.5  Albumin 3.8 - 4.8 g/dL 4.1  AST 0 - 40 IU/L 14  ALT 0 - 32 IU/L 15  Alk Phosphatase 44 - 121 IU/L 105  Total Bilirubin 0.0 - 1.2 mg/dL 0.5    CBC Latest Ref Rng & Units 06/22/2021 06/05/2007  WBC 4.0 - 10.5 K/uL 12.4(H) 11.0(H)  Hemoglobin 12.0 - 15.0 g/dL 16.4(H) 15.1(H)  Hematocrit 36.0 - 46.0 % 48.6(H) 43.1  Platelets 150 - 400 K/uL 343 335   Lab Results  Component Value Date   MCV 93.3 06/22/2021   MCV 91.1 06/05/2007   No results found for: TSH No results found for: HGBA1C   BNP No results found for: BNP  ProBNP No results found for: PROBNP   Lipid Panel     Component Value Date/Time   CHOL 198 07/05/2021 0838   TRIG 94 07/05/2021 0838   HDL 58 07/05/2021 0838   CHOLHDL 3.4 07/05/2021 0838   LDLCALC 123 (H) 07/05/2021 0838   LABVLDL 17 07/05/2021 0838     RADIOLOGY: No results found.   Additional studies/  records that were reviewed today include:  I reviewed her records from her ER evaluation.    ASSESSMENT:    1. Palpitations   2. Paroxysmal SVT (supraventricular tachycardia) (  [place])   3. Elevated LDL cholesterol level   4. Family history of coronary artery disease   5. Family history of stroke   6. COVID: August 14 - 20, 2022     PLAN:  Virginia [name] is a very pleasant 56 -year-old female who denied any known cardiac history.  She tested positive for COVID in August and had symptoms of sinus pressure, sore throat, short-term fever and cough.  Following her recent deep water aerobics class, approximately 20 minutes later she noticed heart rate irregularity and a sensation of vague heart pressure and hot flashes with associated presyncope.  Chest CT was negative for PE.  When I saw her for my initial evaluation on July 04, 2021 her blood pressure was mildly elevated and at times she stated her heart rate would increase to approximately 100 bpm.  Her ECG showed sinus rhythm at 81 bpm.  I described metoprolol to tartrate 25 mg to take on a as needed basis and recommended a echo Doppler assessment as well as 2-week Zio patch monitor.  I reviewed the findings of her echo and monitor evaluations with she and her husband in detail.  She was found to predominant sinus rhythm and had several episodes of SVT with possible atrial tachycardia with variable block.  There was atrial fibrillation with 1% burden.  Her echo Doppler study showed normal LV function with grade 1 diastolic dysfunction with average left ventricular global longitudinal strain at -17.8%.  There was evidence for moderate posterior directed mitral regurgitation.  She has used her metoprolol to tartrate on several occasions since my initial evaluation.  As result, I am changing her to metoprolol succinate to start initially at 25 mg daily but if she continues to experience increased palpitations dose adjustment to 50 mg may be necessary.   Her LDL cholesterol is 123.  With her family history of heart disease, I have recommended she undergo a coronary calcium score.  Presently she is not on any lipid-lowering therapy and if coronary calcification aggressive lipid management will be recommended with target LDL less than 70.  I have given her clearance to undergo her planned colonoscopy with Dr. Collene Mares.  I will see her in 3 months for reevaluation or sooner as needed.   Medication Adjustments/Labs and Tests Ordered: Current medicines are reviewed at length with the patient today.  Concerns regarding medicines are outlined above.  Medication changes, Labs and Tests ordered today are listed in the Patient Instructions below. Patient Instructions  .Medication Instructions:  Start taking  Metoprolol succinate 25 mg  daily  ( if you have more frequent  episodes  we may increase  dose to 37.5 mg or 50 mg call the office)   May use the metoprolol tartrate as need for palp.  *If you need a refill on your cardiac medications before your next appointment, please call your pharmacy*   Lab Work: Not needed If you have labs (blood work) drawn today and your tests are completely normal, you will receive your results only by: [place] (if you have MyChart) OR A paper copy in the mail If you have any lab test that is abnormal or we need to change your treatment, we will call you to review the results.   Testing/Procedures: CT coronary calcium score.   Test locations:  [place] ([address] [place], [place] [address]) [facility] ([address] [place], [place] [address])   This is $99 out of pocket.   Coronary CalciumScan  A coronary calcium scan is an imaging test used to look for deposits of calcium and other fatty materials (plaques) in the inner lining of the blood vessels of the heart (coronary arteries). These deposits of calcium and plaques can partly clog and narrow the coronary arteries without  producing any symptoms or warning signs. This puts a person at risk for a heart attack. This test can detect these deposits before symptoms develop. Tell a health care provider about: Any allergies you have. All medicines you are taking, including vitamins, herbs, eye drops, creams, and over-the-counter medicines. Any problems you or family members have had with anesthetic medicines. Any blood disorders you have. Any surgeries you have had. Any medical conditions you have. Whether you are pregnant or may be pregnant. What are the risks? Generally, this is a safe procedure. However, problems may occur, including: Harm to a pregnant woman and her unborn baby. This test involves the use of radiation. Radiation exposure can be dangerous to a pregnant woman and her unborn baby. If you are pregnant, you generally should not have this procedure done. Slight increase in the risk of cancer. This is because of the radiation involved in the test. What happens before the procedure? No preparation is needed for this procedure. What happens during the procedure? You will undress and remove any jewelry around your neck or chest. You will put on a hospital gown. Sticky electrodes will be placed on your chest. The electrodes will be connected to an electrocardiogram (ECG) machine to record a tracing of the electrical activity of your heart. A CT scanner will take pictures of your heart. During this time, you will be asked to lie still and hold your breath for 2-3 seconds while a picture of your heart is being taken. The procedure may vary among health care providers and hospitals. What happens after the procedure? You can get dressed. You can return to your normal activities. It is up to you to get the results of your test. Ask your health care provider, or the department that is doing the test, when your results will be ready. Summary A coronary calcium scan is an imaging test used to look for deposits of  calcium and other fatty materials (plaques) in the inner lining of the blood vessels of the heart (coronary arteries). Generally, this is a safe procedure. Tell your health care provider if you are pregnant or may be pregnant. No preparation is needed for this procedure. A CT scanner will take pictures of your heart. You can return to your normal activities after the scan is done. This information is not intended to replace advice given to you by your health care provider. Make sure you discuss any questions you have with your health care provider. Document Released: 02/08/2008 Document Revised: 07/01/2016 Document Reviewed: 07/01/2016 Elsevier Interactive Patient Education  2017 [place]: At [facility], you and your health needs are our priority.  As part of our continuing mission to provide you with exceptional heart care, we have created designated Provider Care Teams.  These Care Teams include your primary Cardiologist (physician) and Advanced Practice Providers (APPs -  Physician Assistants and Nurse Practitioners) who all work together to provide you with the care you need, when you need it.     Your next appointment:   3 month(s)  The format for your next appointment:   In Person  Provider:   Shelva Majestic, MD    Other instruction  Okay for  colonoscopy    Signed, Shelva Majestic, MD  09/29/2021 5:48 PM    [facility] [address] [place], [place], [place]  [address] Phone: [phone]

## 2021-09-17 NOTE — Patient Instructions (Addendum)
.Medication Instructions:  Start taking  Metoprolol succinate 25 mg  daily  ( if you have more frequent  episodes  we may increase  dose to 37.5 mg or 50 mg call the office)   May use the metoprolol tartrate as need for palp.  *If you need a refill on your cardiac medications before your next appointment, please call your pharmacy*   Lab Work: Not needed If you have labs (blood work) drawn today and your tests are completely normal, you will receive your results only by: [place] (if you have MyChart) OR A paper copy in the mail If you have any lab test that is abnormal or we need to change your treatment, we will call you to review the results.   Testing/Procedures: CT coronary calcium score.   Test locations:  [facility] ([address] [place], [place] [address]) [facility] ([address] [place], [place] [address])   This is $99 out of pocket.   Coronary CalciumScan A coronary calcium scan is an imaging test used to look for deposits of calcium and other fatty materials (plaques) in the inner lining of the blood vessels of the heart (coronary arteries). These deposits of calcium and plaques can partly clog and narrow the coronary arteries without producing any symptoms or warning signs. This puts a person at risk for a heart attack. This test can detect these deposits before symptoms develop. Tell a health care provider about: Any allergies you have. All medicines you are taking, including vitamins, herbs, eye drops, creams, and over-the-counter medicines. Any problems you or family members have had with anesthetic medicines. Any blood disorders you have. Any surgeries you have had. Any medical conditions you have. Whether you are pregnant or may be pregnant. What are the risks? Generally, this is a safe procedure. However, problems may occur, including: Harm to a pregnant woman and her unborn baby. This test involves the use of radiation.  Radiation exposure can be dangerous to a pregnant woman and her unborn baby. If you are pregnant, you generally should not have this procedure done. Slight increase in the risk of cancer. This is because of the radiation involved in the test. What happens before the procedure? No preparation is needed for this procedure. What happens during the procedure? You will undress and remove any jewelry around your neck or chest. You will put on a hospital gown. Sticky electrodes will be placed on your chest. The electrodes will be connected to an electrocardiogram (ECG) machine to record a tracing of the electrical activity of your heart. A CT scanner will take pictures of your heart. During this time, you will be asked to lie still and hold your breath for 2-3 seconds while a picture of your heart is being taken. The procedure may vary among health care providers and hospitals. What happens after the procedure? You can get dressed. You can return to your normal activities. It is up to you to get the results of your test. Ask your health care provider, or the department that is doing the test, when your results will be ready. Summary A coronary calcium scan is an imaging test used to look for deposits of calcium and other fatty materials (plaques) in the inner lining of the blood vessels of the heart (coronary arteries). Generally, this is a safe procedure. Tell your health care provider if you are pregnant or may be pregnant. No preparation is needed for this procedure. A CT scanner will take pictures of  your heart. You can return to your normal activities after the scan is done. This information is not intended to replace advice given to you by your health care provider. Make sure you discuss any questions you have with your health care provider. Document Released: 02/08/2008 Document Revised: 07/01/2016 Document Reviewed: 07/01/2016 Elsevier Interactive Patient Education  2017 [place]: At [facility] you and your health needs are our priority.  As part of our continuing mission to provide you with exceptional heart care, we have created designated Provider Care Teams.  These Care Teams include your primary Cardiologist (physician) and Advanced Practice Providers (APPs -  Physician Assistants and Nurse Practitioners) who all work together to provide you with the care you need, when you need it.     Your next appointment:   3 month(s)  The format for your next appointment:   In Person  Provider:   Shelva Majestic, MD    Other instruction  Okay for colonoscopy

## 2021-09-17 NOTE — Progress Notes (Incomplete)
Cardiology Office Note    Date:  09/17/2021   ID:  Virginia [name] [birth year], MRN [id number]  PCP:  Virginia Limes, MD  Cardiologist:  Virginia Majestic, MD   New cardiology evaluation, referred to the courtesy of Dr. Lorriane [name] and [place] [facility]   History of Present Illness:  Virginia [name] is a 71 y.o. female who was recently evaluated at [facility] emergency department after experiencing a presyncopal episode shortly after doing deep water aerobics.  [name] denies any known prior cardiac history.  She tested positive for COVID on August 14 and had symptoms of sinus pressure, sore throat, fever and cough.  She tested negative on August 27.  She had travel to [place] in [place] from September 14 through the 29th and did well with travel and was asymptomatic.  She denies any history of exertional chest pain or change in exercise tolerance.  She exercises regularly and does deep water aerobics at least 3 days/week for 60 minutes without difficulty.  On October 28 approximately 20 to 30 minutes after returning from her swim class, she had sudden onset of a feeling of palpitations and thought she was going to pass out.  There was vague chest pressure and hot flashes with some shortness of breath.  She presented to [facility] ER and on presentation all symptoms had resolved.  She noted mild heart rate irregularity for total of 20 minutes.  During that evaluation, her blood pressure was 109/78.  Her pulse initially was 106.  She was afebrile.  Laboratory was essentially normal.  Her ECG showed sinus tachycardia with poor anterior R wave progression.  Chest x-ray did not reveal any active cardiopulmonary disease.  A CT angio of her chest was performed which did not reveal any acute pulmonary embolus or other acute abnormality.  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  She is referred for cardiology evaluation.  Past  Medical History:  Diagnosis Date   Hypertension     Past Surgical History:  Procedure Laterality Date   uterine polyectomy      Current Medications: Outpatient Medications Prior to Visit  Medication Sig Dispense Refill   cholecalciferol (VITAMIN D3) 25 MCG (1000 UNIT) tablet 1 tablet     conjugated estrogens (PREMARIN) vaginal cream USE VAGINALLY 2 TIMES A WEEK AS DIRECTED     FLUZONE HIGH-DOSE QUADRIVALENT 0.7 ML SUSY      metoprolol tartrate (LOPRESSOR) 25 MG tablet Take one tablet daily as needed. 30 tablet 6   Oyster Shell Calcium 500 MG TABS 1 tablet with meals     PFIZER COVID-19 VAC BIVALENT injection      No facility-administered medications prior to visit.     Allergies:   Sulfonamide derivatives   Social History   Socioeconomic History   Marital status: Married    Spouse name: Not on file   Number of children: Not on file   Years of education: Not on file   Highest education level: Not on file  Occupational History   Not on file  Tobacco Use   Smoking status: Never   Smokeless tobacco: Never  Vaping Use   Vaping Use: Never used  Substance and Sexual Activity   Alcohol use: Never   Drug use: Never   Sexual activity: Not on file  Other Topics Concern   Not on file  Social History Narrative   Not on  file   Social Determinants of Health   Financial Resource Strain: Not on file  Food Insecurity: Not on file  Transportation Needs: Not on file  Physical Activity: Not on file  Stress: Not on file  Social Connections: Not on file    Socially she was born in [place].  She is married for 45 years and has 2 children and 2 great-grandchildren.  There is no history of tobacco use and she does not drink alcohol.  She exercises regularly.  Family History: Her mother died at age 79 and had a stroke which reportedly was misdiagnosed as a brain tumor.  Her father died at age 34 with hypertension and a stroke.  She has 1 brother with mitral valve prolapse and has  CAD status post stenting.  ROS General: Negative; No fevers, chills, or night sweats;  HEENT: Negative; No changes in vision or hearing, sinus congestion, difficulty swallowing Pulmonary: Negative; No cough, wheezing, shortness of breath, hemoptysis Cardiovascular: Negative; No chest pain, presyncope, syncope, palpitations GI: Negative; No nausea, vomiting, diarrhea, or abdominal pain GU: Negative; No dysuria, hematuria, or difficulty voiding Musculoskeletal: Negative; no myalgias, joint pain, or weakness Hematologic/Oncology: Negative; no easy bruising, bleeding Endocrine: Negative; no heat/cold intolerance; no diabetes Neuro: Negative; no changes in balance, headaches Skin: Negative; No rashes or skin lesions Psychiatric: Negative; No behavioral problems, depression Sleep: Negative; No snoring, daytime sleepiness, hypersomnolence, bruxism, restless legs, hypnogognic hallucinations, no cataplexy Other comprehensive 14 point system review is negative.   PHYSICAL EXAM:   VS:  BP (!) 146/82    Pulse 98    Ht 5' 4.5" (1.638 m)    Wt 178 lb 12.8 oz (81.1 kg)    SpO2 98%    BMI 30.22 kg/m     Repeat blood pressure by me was 140/78 initially and on repeat was 130/76.  Wt Readings from Last 3 Encounters:  09/17/21 178 lb 12.8 oz (81.1 kg)  07/04/21 169 lb (76.7 kg)  06/22/21 169 lb (76.7 kg)    General: Alert, oriented, no distress.  Skin: normal turgor, no rashes, warm and dry HEENT: Normocephalic, atraumatic. Pupils equal round and reactive to light; sclera anicteric; extraocular muscles intact;  Nose without nasal septal hypertrophy Mouth/Parynx benign; Mallinpatti scale 2 Neck: No JVD, no carotid bruits; normal carotid upstroke Lungs: clear to ausculatation and percussion; no wheezing or rales Chest wall: without tenderness to palpitation Heart: PMI not displaced, RRR, s1 s2 normal, 1/6 systolic murmur, no diastolic murmur, no rubs, gallops, thrills, or heaves Abdomen: soft,  nontender; no hepatosplenomehaly, BS+; abdominal aorta nontender and not dilated by palpation. Back: no CVA tenderness Pulses 2+ Musculoskeletal: full range of motion, normal strength, no joint deformities Extremities: no clubbing cyanosis or edema, Homan's sign negative  Neurologic: grossly nonfocal; Cranial nerves grossly wnl Psychologic: Normal mood and affect   Studies/Labs Reviewed:   EKG:  EKG is ordered today.  ECG (independently read by me): NSR at 81; no ectopy, normal intervlas  Recent Labs: BMP Latest Ref Rng & Units 07/05/2021 06/22/2021  Glucose 70 - 99 mg/dL 94 101(H)  BUN 8 - 27 mg/dL 13 14  Creatinine 0.57 - 1.00 mg/dL 0.86 0.92  BUN/Creat Ratio 12 - 28 15 -  Sodium 134 - 144 mmol/L 142 139  Potassium 3.5 - 5.2 mmol/L 4.3 3.9  Chloride 96 - 106 mmol/L 105 104  CO2 20 - 29 mmol/L 26 25  Calcium 8.7 - 10.3 mg/dL 9.4 9.7     Hepatic Function Latest  Ref Rng & Units 07/05/2021  Total Protein 6.0 - 8.5 g/dL 6.5  Albumin 3.8 - 4.8 g/dL 4.1  AST 0 - 40 IU/L 14  ALT 0 - 32 IU/L 15  Alk Phosphatase 44 - 121 IU/L 105  Total Bilirubin 0.0 - 1.2 mg/dL 0.5    CBC Latest Ref Rng & Units 06/22/2021 06/05/2007  WBC 4.0 - 10.5 K/uL 12.4(H) 11.0(H)  Hemoglobin 12.0 - 15.0 g/dL 16.4(H) 15.1(H)  Hematocrit 36.0 - 46.0 % 48.6(H) 43.1  Platelets 150 - 400 K/uL 343 335   Lab Results  Component Value Date   MCV 93.3 06/22/2021   MCV 91.1 06/05/2007   No results found for: TSH No results found for: HGBA1C   BNP No results found for: BNP  ProBNP No results found for: PROBNP   Lipid Panel     Component Value Date/Time   CHOL 198 07/05/2021 0838   TRIG 94 07/05/2021 0838   HDL 58 07/05/2021 0838   CHOLHDL 3.4 07/05/2021 0838   LDLCALC 123 (H) 07/05/2021 0838   LABVLDL 17 07/05/2021 0838     RADIOLOGY: No results found.   Additional studies/ records that were reviewed today include:  I reviewed her records from her ER evaluation.    ASSESSMENT:    No  diagnosis found.   PLAN:  Ms. Alila Sotero is a very pleasant 71 year old female who denies any known cardiac history.  She tested positive for COVID in August and had symptoms of sinus pressure, sore throat, short-term fever and cough.  Following her recent deep water aerobics class, approximately 20 minutes later she noticed heart rate irregularity and a sensation of vague heart pressure and hot flashes with associated presyncope.  Chest CT was negative for PE.  Her blood pressure is elevated today as she has noticed at times her heart rate increasing to approximately 100.  Her ECG shows sinus rhythm at 81 without abnormality.  With her recent COVID infection, I am recommending she undergo a 2D echo Doppler study for assessment of LV systolic and diastolic function.  I have also have recommended she wear a Zio patch monitor for 14 days.  I am giving her a prescription of metoprolol tartrate to take 25 mg on a as needed basis if she does experience recurrent tachypalpitations.  I will recheck laboratory including a comprehensive metabolic panel, magnesium level, and fasting lipid panel.  I reviewed the laboratory that had been sent from the recent ER evaluation.  Her TSH level was 1.43.  I will see her in follow-up of the above studies and further recommendations will be made at that time.   Medication Adjustments/Labs and Tests Ordered: Current medicines are reviewed at length with the patient today.  Concerns regarding medicines are outlined above.  Medication changes, Labs and Tests ordered today are listed in the Patient Instructions below. There are no Patient Instructions on file for this visit.   Signed, Virginia Majestic, MD  09/17/2021 9:51 AM    [facility] [address] [place], [place], [place]  [address] Phone: [phone]

## 2021-09-20 NOTE — Telephone Encounter (Signed)
Patient seen on 01/23

## 2021-09-25 DIAGNOSIS — Z01419 Encounter for gynecological examination (general) (routine) without abnormal findings: Secondary | ICD-10-CM | POA: Diagnosis not present

## 2021-09-25 DIAGNOSIS — Z1231 Encounter for screening mammogram for malignant neoplasm of breast: Secondary | ICD-10-CM | POA: Diagnosis not present

## 2021-09-25 DIAGNOSIS — Z6830 Body mass index (BMI) 30.0-30.9, adult: Secondary | ICD-10-CM | POA: Diagnosis not present

## 2021-09-25 DIAGNOSIS — Z124 Encounter for screening for malignant neoplasm of cervix: Secondary | ICD-10-CM | POA: Diagnosis not present

## 2021-09-25 DIAGNOSIS — R69 Illness, unspecified: Secondary | ICD-10-CM | POA: Diagnosis not present

## 2021-09-26 DIAGNOSIS — K5730 Diverticulosis of large intestine without perforation or abscess without bleeding: Secondary | ICD-10-CM | POA: Diagnosis not present

## 2021-09-26 DIAGNOSIS — K635 Polyp of colon: Secondary | ICD-10-CM | POA: Diagnosis not present

## 2021-09-26 DIAGNOSIS — D120 Benign neoplasm of cecum: Secondary | ICD-10-CM | POA: Diagnosis not present

## 2021-09-26 DIAGNOSIS — Z1211 Encounter for screening for malignant neoplasm of colon: Secondary | ICD-10-CM | POA: Diagnosis not present

## 2021-09-29 ENCOUNTER — Encounter: Payer: Self-pay | Admitting: Cardiovascular Disease

## 2021-10-01 ENCOUNTER — Other Ambulatory Visit: Payer: Self-pay

## 2021-10-01 ENCOUNTER — Ambulatory Visit (HOSPITAL_BASED_OUTPATIENT_CLINIC_OR_DEPARTMENT_OTHER)
Admission: RE | Admit: 2021-10-01 | Discharge: 2021-10-01 | Disposition: A | Discharge status: Home / Self Care | Payer: Medicare HMO | Source: Ambulatory Visit | Attending: Cardiovascular Disease | Admitting: Cardiovascular Disease

## 2021-10-01 DIAGNOSIS — Z823 Family history of stroke: Secondary | ICD-10-CM | POA: Insufficient documentation

## 2021-10-01 DIAGNOSIS — R002 Palpitations: Secondary | ICD-10-CM | POA: Insufficient documentation

## 2021-10-01 DIAGNOSIS — Z8249 Family history of ischemic heart disease and other diseases of the circulatory system: Secondary | ICD-10-CM | POA: Insufficient documentation

## 2021-10-01 DIAGNOSIS — E7800 Pure hypercholesterolemia, unspecified: Secondary | ICD-10-CM | POA: Insufficient documentation

## 2021-10-02 DIAGNOSIS — R69 Illness, unspecified: Secondary | ICD-10-CM | POA: Diagnosis not present

## 2021-10-02 DIAGNOSIS — F419 Anxiety disorder, unspecified: Secondary | ICD-10-CM | POA: Diagnosis not present

## 2021-10-15 DIAGNOSIS — R69 Illness, unspecified: Secondary | ICD-10-CM | POA: Diagnosis not present

## 2021-10-15 DIAGNOSIS — F419 Anxiety disorder, unspecified: Secondary | ICD-10-CM | POA: Diagnosis not present

## 2021-10-24 DIAGNOSIS — F419 Anxiety disorder, unspecified: Secondary | ICD-10-CM | POA: Diagnosis not present

## 2021-10-24 DIAGNOSIS — R69 Illness, unspecified: Secondary | ICD-10-CM | POA: Diagnosis not present

## 2021-11-05 DIAGNOSIS — R69 Illness, unspecified: Secondary | ICD-10-CM | POA: Diagnosis not present

## 2021-11-05 DIAGNOSIS — F419 Anxiety disorder, unspecified: Secondary | ICD-10-CM | POA: Diagnosis not present

## 2021-11-20 DIAGNOSIS — F419 Anxiety disorder, unspecified: Secondary | ICD-10-CM | POA: Diagnosis not present

## 2021-11-20 DIAGNOSIS — R69 Illness, unspecified: Secondary | ICD-10-CM | POA: Diagnosis not present

## 2021-11-21 DIAGNOSIS — H1045 Other chronic allergic conjunctivitis: Secondary | ICD-10-CM | POA: Diagnosis not present

## 2021-11-21 DIAGNOSIS — H04123 Dry eye syndrome of bilateral lacrimal glands: Secondary | ICD-10-CM | POA: Diagnosis not present

## 2021-11-21 DIAGNOSIS — Z961 Presence of intraocular lens: Secondary | ICD-10-CM | POA: Diagnosis not present

## 2021-11-21 DIAGNOSIS — D3132 Benign neoplasm of left choroid: Secondary | ICD-10-CM | POA: Diagnosis not present

## 2021-11-21 DIAGNOSIS — H26492 Other secondary cataract, left eye: Secondary | ICD-10-CM | POA: Diagnosis not present

## 2021-11-21 DIAGNOSIS — H43813 Vitreous degeneration, bilateral: Secondary | ICD-10-CM | POA: Diagnosis not present

## 2021-12-04 DIAGNOSIS — R69 Illness, unspecified: Secondary | ICD-10-CM | POA: Diagnosis not present

## 2021-12-04 DIAGNOSIS — F419 Anxiety disorder, unspecified: Secondary | ICD-10-CM | POA: Diagnosis not present

## 2022-01-09 ENCOUNTER — Ambulatory Visit: Payer: Medicare HMO | Admitting: Cardiovascular Disease

## 2022-01-09 ENCOUNTER — Encounter: Payer: Self-pay | Admitting: Cardiovascular Disease

## 2022-01-09 DIAGNOSIS — U071 COVID-19: Secondary | ICD-10-CM | POA: Diagnosis not present

## 2022-01-09 DIAGNOSIS — Z8249 Family history of ischemic heart disease and other diseases of the circulatory system: Secondary | ICD-10-CM

## 2022-01-09 DIAGNOSIS — E7800 Pure hypercholesterolemia, unspecified: Secondary | ICD-10-CM

## 2022-01-09 DIAGNOSIS — I471 Supraventricular tachycardia: Secondary | ICD-10-CM | POA: Diagnosis not present

## 2022-01-09 DIAGNOSIS — R002 Palpitations: Secondary | ICD-10-CM | POA: Diagnosis not present

## 2022-01-09 NOTE — Patient Instructions (Signed)

## 2022-01-09 NOTE — Progress Notes (Signed)
Cardiology Office Note    Date:  01/18/2022   ID:  Virginia, [name] 03-07-1951, MRN [id number]  PCP:  Michael Boston, MD  Cardiologist:  Shelva Majestic, MD   4 month F/U cardiology evaluation, initially referred to the courtesy of Dr. Lorriane Shire and [facility]   History of Present Illness:  Virginia [name] is a 71 y.o. female who was recently evaluated at [facility] emergency department after experiencing a presyncopal episode shortly after doing deep water aerobics.  I saw Virginia [name] for her new cardiology evaluation on July 04, 2021. Virginia [name] denies any known prior cardiac history.  She tested positive for COVID on August 14 and had symptoms of sinus pressure, sore throat, fever and cough.  She tested negative on August 27.  She had travel to [place] in [place] from September 14 through the 29th and did well with travel and was asymptomatic.  She denies any history of exertional chest pain or change in exercise tolerance.  She exercises regularly and does deep water aerobics at least 3 days/week for 60 minutes without difficulty.  On October 28 approximately 20 to 30 minutes after returning from her swim class, she had sudden onset of a feeling of palpitations and thought she was going to pass out.  There was vague chest pressure and hot flashes with some shortness of breath.  She presented to [facility] ER and on presentation all symptoms had resolved.  She noted mild heart rate irregularity for total of 20 minutes.  During that evaluation, her blood pressure was 109/78.  Her pulse initially was 106.  She was afebrile.  Laboratory was essentially normal.  Her ECG showed sinus tachycardia with poor anterior R wave progression.  Chest x-ray did not reveal any active cardiopulmonary disease.  A CT angio of her chest was performed which did not reveal any acute pulmonary embolus or other acute abnormality.  She was noted to have some degenerative musculoskeletal changes at T11 and 12 and had a  vertebral body hemangioma at T11.  During that evaluation her ECG showed sinus rhythm at 81 without abnormality.  With her recent COVID infection I recommended a 2D echo Doppler study for assessment of LV systolic and diastolic function.  I also recommended that she wear a Zio patch monitor for 14 days.  Repeat laboratory was recommended.  Virginia [name] underwent echo Doppler study on July 26, 2021 which showed an EF of 55 to 60%.  She had grade 1 diastolic dysfunction.  There was evidence for moderate posterior directed mitral regurgitation and it was felt that the mechanism of the MR was due to tethering of the posterior leaflet.  She wore a 2-week Zio patch monitor from November 13 through July 22, 2021.  The predominant rhythm was sinus rhythm with an average rate at 80 bpm.  The slowest rate was 54 bpm which occurred at 10:58 PM and fastest was sinus tachycardia which occurred at 7:49 AM.  There was atrial fibrillation with a 1% burden.  Longest interval lasted 3 hours and 29 minutes with an average rate at 132.  There were short-lived episodes of SVT with possible atrial tachycardia with variable block.  The fastest burst of SVT lasted 9 beats.  There were occasional PACs with rare couplets, rare atrial triplets and rare ventricular ectopy with an episode of ventricular trigeminy.  I saw her on September 17, 2021 for follow-up evaluation.  At that time she felt well.  At her  initial evaluation she was given a prescription for metoprolol tartrate 25 mg to take as needed. Following her Holter monitor, she had taken a dose of metoprolol to tartrate due to short sensations of heart pain getting on November 26, November 27, December 1, December 3, December 4, December 5, and on December 14.  She has seen her primary provider Dr. Dionne Milo while on September 06, 2021 and was stable.  She has noticed some mild chest wall soreness which is musculoskeletal.  There is no exertional precipitation.  She is scheduled to  undergo colonoscopy by Dr. Collene Mares recent laboratory has shown an LDL cholesterol at 123.  She had normal renal function.  With her family history of heart disease I recommended she undergo a coronary calcium score.  At that time she was not on any lipid-lowering therapy.  I gave her clearance to undergo planned colonoscopy with Dr. Collene Mares . Presently, she feels well.  She just returned from a [place] with her husband.  She had undergone colonoscopy and was found to have 1 polyp.  She has been doing water aerobics.  She denies chest pain or palpitations.  She underwent cardiac calcium score evaluation on October 01, 2021 and calcium score was 0.  She remains active.  She denies presyncope or syncope.  HEENT she continues to be on metoprolol succinate 25 mg daily and is not having any recurrent palpitations.  She also has a prescription for metoprolol to tartrate to take as needed.  She presents for reevaluation.   Past Medical History:  Diagnosis Date   Hypertension     Past Surgical History:  Procedure Laterality Date   uterine polyectomy      Current Medications: Outpatient Medications Prior to Visit  Medication Sig Dispense Refill   cholecalciferol (VITAMIN D3) 25 MCG (1000 UNIT) tablet 1 tablet     conjugated estrogens (PREMARIN) vaginal cream USE VAGINALLY 2 TIMES A WEEK AS DIRECTED     metoprolol succinate (TOPROL XL) 25 MG 24 hr tablet Take 1 tablet (25 mg total) by mouth daily. 90 tablet 2   metoprolol tartrate (LOPRESSOR) 25 MG tablet Take one tablet daily as needed. 30 tablet 6   Oyster Shell Calcium 500 MG TABS 1 tablet with meals     FLUZONE HIGH-DOSE QUADRIVALENT 0.7 ML SUSY  (Patient not taking: Reported on 01/09/2022)     PFIZER COVID-19 VAC BIVALENT injection  (Patient not taking: Reported on 01/09/2022)     No facility-administered medications prior to visit.     Allergies:   Sulfonamide derivatives   Social History   Socioeconomic History   Marital status: Married     Spouse name: Not on file   Number of children: Not on file   Years of education: Not on file   Highest education level: Not on file  Occupational History   Not on file  Tobacco Use   Smoking status: Never   Smokeless tobacco: Never  Vaping Use   Vaping Use: Never used  Substance and Sexual Activity   Alcohol use: Never   Drug use: Never   Sexual activity: Not on file  Other Topics Concern   Not on file  Social History Narrative   Not on file   Social Determinants of Health   Financial Resource Strain: Not on file  Food Insecurity: Not on file  Transportation Needs: Not on file  Physical Activity: Not on file  Stress: Not on file  Social Connections: Not on file  Socially she was born in [place].  She is married for 45 years and has 2 children and 2 great-grandchildren.  There is no history of tobacco use and she does not drink alcohol.  She exercises regularly.  Family History: Her mother died at age 62 and had a stroke which reportedly was misdiagnosed as a brain tumor.  Her father died at age 41 with hypertension and a stroke.  She has 1 brother with mitral valve prolapse and has CAD status post stenting.  ROS General: Negative; No fevers, chills, or night sweats;  HEENT: Negative; No changes in vision or hearing, sinus congestion, difficulty swallowing Pulmonary: Negative; No cough, wheezing, shortness of breath, hemoptysis Cardiovascular: See HPI GI: Negative; No nausea, vomiting, diarrhea, or abdominal pain GU: Negative; No dysuria, hematuria, or difficulty voiding Musculoskeletal: Negative; no myalgias, joint pain, or weakness Hematologic/Oncology: Negative; no easy bruising, bleeding Endocrine: Negative; no heat/cold intolerance; no diabetes Neuro: Negative; no changes in balance, headaches Skin: Negative; No rashes or skin lesions Psychiatric: Negative; No behavioral problems, depression Sleep: Negative; No snoring, daytime sleepiness, hypersomnolence,  bruxism, restless legs, hypnogognic hallucinations, no cataplexy Other comprehensive 14 point system review is negative.   PHYSICAL EXAM:   VS:  BP 132/80   Pulse 69   Ht 5' 4"  (1.626 m)   Wt 182 lb 3.2 oz (82.6 kg)   SpO2 98%   BMI 31.27 kg/m     Repeat blood pressure by me was 132/78  Wt Readings from Last 3 Encounters:  01/09/22 182 lb 3.2 oz (82.6 kg)  09/17/21 178 lb 12.8 oz (81.1 kg)  07/04/21 169 lb (76.7 kg)    General: Alert, oriented, no distress.  Skin: normal turgor, no rashes, warm and dry HEENT: Normocephalic, atraumatic. Pupils equal round and reactive to light; sclera anicteric; extraocular muscles intact;  Nose without nasal septal hypertrophy Mouth/Parynx benign; Mallinpatti scale 2 Neck: No JVD, no carotid bruits; normal carotid upstroke Lungs: clear to ausculatation and percussion; no wheezing or rales Chest wall: without tenderness to palpitation Heart: PMI not displaced, RRR, s1 s2 normal, 1/6 systolic murmur, no diastolic murmur, no rubs, gallops, thrills, or heaves Abdomen: soft, nontender; no hepatosplenomehaly, BS+; abdominal aorta nontender and not dilated by palpation. Back: no CVA tenderness Pulses 2+ Musculoskeletal: full range of motion, normal strength, no joint deformities Extremities: no clubbing cyanosis or edema, Homan's sign negative  Neurologic: grossly nonfocal; Cranial nerves grossly wnl Psychologic: Normal mood and affect   Studies/Labs Reviewed:    Jan 09, 2022 ECG (independently read by me): NSR at 69, QS V1-4  September 17, 2021 ECG (independently read by me): NSR at 98, no ectopy, normal intervals  July 04, 2021 ECG (independently read by me): NSR at 81; no ectopy, normal intervlas  Recent Labs:    Latest Ref Rng & Units 07/05/2021    8:38 AM 06/22/2021   12:05 PM  BMP  Glucose 70 - 99 mg/dL 94   101    BUN 8 - 27 mg/dL 13   14    Creatinine 0.57 - 1.00 mg/dL 0.86   0.92    BUN/Creat Ratio 12 - 28 15     Sodium 134  - 144 mmol/L 142   139    Potassium 3.5 - 5.2 mmol/L 4.3   3.9    Chloride 96 - 106 mmol/L 105   104    CO2 20 - 29 mmol/L 26   25    Calcium 8.7 - 10.3 mg/dL 9.4  9.7          Latest Ref Rng & Units 07/05/2021    8:38 AM  Hepatic Function  Total Protein 6.0 - 8.5 g/dL 6.5    Albumin 3.8 - 4.8 g/dL 4.1    AST 0 - 40 IU/L 14    ALT 0 - 32 IU/L 15    Alk Phosphatase 44 - 121 IU/L 105    Total Bilirubin 0.0 - 1.2 mg/dL 0.5         Latest Ref Rng & Units 06/22/2021   12:05 PM 06/05/2007   11:51 AM  CBC  WBC 4.0 - 10.5 K/uL 12.4   11.0    Hemoglobin 12.0 - 15.0 g/dL 16.4   15.1    Hematocrit 36.0 - 46.0 % 48.6   43.1    Platelets 150 - 400 K/uL 343   335     Lab Results  Component Value Date   MCV 93.3 06/22/2021   MCV 91.1 06/05/2007   No results found for: TSH No results found for: HGBA1C   BNP No results found for: BNP  ProBNP No results found for: PROBNP   Lipid Panel     Component Value Date/Time   CHOL 198 07/05/2021 0838   TRIG 94 07/05/2021 0838   HDL 58 07/05/2021 0838   CHOLHDL 3.4 07/05/2021 0838   LDLCALC 123 (H) 07/05/2021 0838   LABVLDL 17 07/05/2021 0838     RADIOLOGY: No results found.   Additional studies/ records that were reviewed today include:  I reviewed her records from her ER evaluation.    ASSESSMENT:    1. Paroxysmal SVT (supraventricular tachycardia) (HCC)   2. Palpitations   3. Elevated LDL cholesterol level   4. Family history of coronary artery disease   5. COVID: August 14 - 20, 2022     PLAN:  Ms. Brodi Nery is a very pleasant 53 -year-old female who denied any known cardiac history.  She tested positive for COVID in August and had symptoms of sinus pressure, sore throat, short-term fever and cough.  Following her recent deep water aerobics class, approximately 20 minutes later she noticed heart rate irregularity and a sensation of vague heart pressure and hot flashes with associated presyncope.  Chest CT was  negative for PE.  When I saw her for my initial evaluation on July 04, 2021 her blood pressure was mildly elevated and at times she stated her heart rate would increase to approximately 100 bpm.  Her ECG showed sinus rhythm at 81 bpm.  I described metoprolol to tartrate 25 mg to take on a as needed basis and recommended a echo Doppler assessment as well as 2-week Zio patch monitor.  I reviewed the findings of her echo and monitor evaluations with she and her husband in detail.  She was found to predominant sinus rhythm and had several episodes of SVT with possible atrial tachycardia with variable block.  There was atrial fibrillation with 1% burden.  Her echo Doppler study showed normal LV function with grade 1 diastolic dysfunction with average left ventricular global longitudinal strain at -17.8%.  There was evidence for moderate posterior directed mitral regurgitation.  She has used her metoprolol to tartrate on several occasions since my initial evaluation.  At her subsequent office visit in January 2023 I changed her to metoprolol succinate to start initially at 25 mg daily and to dose adjust depending upon symptomatology.  I also recommended that she can take a as needed metoprolol  to tartrate if there were breakthrough arrhythmias.  With her LDL of 123 and family history for CAD I recommended she undergo coronary calcium score.  Calcium score was 0.  She has resumed activity and is back doing water aerobics.  She stays active.  She tolerated her colonoscopy and was found to have 1 polyp.  She and her husband recently returned from a [place] cruise through [place] and up to [place].  Last summer she had gone to [place].  She denies any chest pain or recurrent palpitations.  She returned to Dr. Cristie Hem for per primary care.  I will see her in 1 year for reevaluation.  Medication Adjustments/Labs and Tests Ordered: Current medicines are reviewed at length with the patient today.  Concerns regarding  medicines are outlined above.  Medication changes, Labs and Tests ordered today are listed in the Patient Instructions below. Patient Instructions  Medication Instructions:  The current medical regimen is effective;  continue present plan and medications.  *If you need a refill on your cardiac medications before your next appointment, please call your pharmacy*   Follow-Up: At [facility], you and your health needs are our priority.  As part of our continuing mission to provide you with exceptional heart care, we have created designated Provider Care Teams.  These Care Teams include your primary Cardiologist (physician) and Advanced Practice Providers (APPs -  Physician Assistants and Nurse Practitioners) who all work together to provide you with the care you need, when you need it.  We recommend signing up for the patient portal called "MyChart".  Sign up information is provided on this After Visit Summary.  MyChart is used to connect with patients for Virtual Visits (Telemedicine).  Patients are able to view lab/test results, encounter notes, upcoming appointments, etc.  Non-urgent messages can be sent to your provider as well.   To learn more about what you can do with MyChart, go to [email or web address].    Your next appointment:   12 month(s)  The format for your next appointment:   In Person  Provider:   Shelva Majestic, MD {         Signed, Shelva Majestic, MD  01/18/2022 [place] [address] [place], [place], [place]  [address] Phone: [phone]

## 2022-01-18 ENCOUNTER — Encounter: Payer: Self-pay | Admitting: Cardiovascular Disease

## 2022-05-09 ENCOUNTER — Other Ambulatory Visit: Payer: Self-pay | Admitting: Cardiovascular Disease

## 2022-09-06 DIAGNOSIS — R7989 Other specified abnormal findings of blood chemistry: Secondary | ICD-10-CM | POA: Diagnosis not present

## 2022-09-06 DIAGNOSIS — I10 Essential (primary) hypertension: Secondary | ICD-10-CM | POA: Diagnosis not present

## 2022-09-06 DIAGNOSIS — E785 Hyperlipidemia, unspecified: Secondary | ICD-10-CM | POA: Diagnosis not present

## 2022-09-10 DIAGNOSIS — N6452 Nipple discharge: Secondary | ICD-10-CM | POA: Diagnosis not present

## 2022-09-11 ENCOUNTER — Other Ambulatory Visit: Payer: Self-pay | Admitting: Obstetrics and Gynecology

## 2022-09-11 DIAGNOSIS — N6452 Nipple discharge: Secondary | ICD-10-CM

## 2022-09-13 DIAGNOSIS — M7661 Achilles tendinitis, right leg: Secondary | ICD-10-CM | POA: Diagnosis not present

## 2022-09-13 DIAGNOSIS — I10 Essential (primary) hypertension: Secondary | ICD-10-CM | POA: Diagnosis not present

## 2022-09-13 DIAGNOSIS — Z1339 Encounter for screening examination for other mental health and behavioral disorders: Secondary | ICD-10-CM | POA: Diagnosis not present

## 2022-09-13 DIAGNOSIS — Z1331 Encounter for screening for depression: Secondary | ICD-10-CM | POA: Diagnosis not present

## 2022-09-13 DIAGNOSIS — E785 Hyperlipidemia, unspecified: Secondary | ICD-10-CM | POA: Diagnosis not present

## 2022-09-13 DIAGNOSIS — Z0000 Encounter for general adult medical examination without abnormal findings: Secondary | ICD-10-CM | POA: Diagnosis not present

## 2022-09-13 DIAGNOSIS — I471 Supraventricular tachycardia, unspecified: Secondary | ICD-10-CM | POA: Diagnosis not present

## 2022-09-16 ENCOUNTER — Ambulatory Visit
Admission: RE | Admit: 2022-09-16 | Discharge: 2022-09-16 | Disposition: A | Discharge status: Home / Self Care | Payer: Medicare HMO | Source: Ambulatory Visit | Attending: Obstetrics and Gynecology | Admitting: Obstetrics and Gynecology

## 2022-09-16 ENCOUNTER — Other Ambulatory Visit: Payer: Self-pay | Admitting: Obstetrics and Gynecology

## 2022-09-16 DIAGNOSIS — N6452 Nipple discharge: Secondary | ICD-10-CM

## 2022-09-20 ENCOUNTER — Other Ambulatory Visit: Payer: Self-pay | Admitting: Obstetrics and Gynecology

## 2022-09-20 DIAGNOSIS — N6452 Nipple discharge: Secondary | ICD-10-CM

## 2022-10-12 ENCOUNTER — Ambulatory Visit
Admission: RE | Admit: 2022-10-12 | Discharge: 2022-10-12 | Disposition: A | Discharge status: Home / Self Care | Payer: Medicare HMO | Source: Ambulatory Visit | Attending: Obstetrics and Gynecology | Admitting: Obstetrics and Gynecology

## 2022-10-12 DIAGNOSIS — N6452 Nipple discharge: Secondary | ICD-10-CM

## 2022-10-12 DIAGNOSIS — N6323 Unspecified lump in the left breast, lower outer quadrant: Secondary | ICD-10-CM | POA: Diagnosis not present

## 2022-10-12 MED ORDER — GADOPICLENOL 0.5 MMOL/ML IV SOLN
8.0000 mL | Freq: Once | INTRAVENOUS | Status: AC | PRN
  Administered 2022-10-12: 8 mL via INTRAVENOUS

## 2022-10-14 ENCOUNTER — Other Ambulatory Visit: Payer: Self-pay | Admitting: Obstetrics and Gynecology

## 2022-10-14 DIAGNOSIS — R9389 Abnormal findings on diagnostic imaging of other specified body structures: Secondary | ICD-10-CM

## 2022-10-15 ENCOUNTER — Other Ambulatory Visit: Payer: Self-pay | Admitting: Obstetrics and Gynecology

## 2022-10-15 DIAGNOSIS — Z01419 Encounter for gynecological examination (general) (routine) without abnormal findings: Secondary | ICD-10-CM | POA: Diagnosis not present

## 2022-10-15 DIAGNOSIS — R9389 Abnormal findings on diagnostic imaging of other specified body structures: Secondary | ICD-10-CM

## 2022-10-15 DIAGNOSIS — R29890 Loss of height: Secondary | ICD-10-CM | POA: Diagnosis not present

## 2022-10-15 DIAGNOSIS — N958 Other specified menopausal and perimenopausal disorders: Secondary | ICD-10-CM | POA: Diagnosis not present

## 2022-10-15 DIAGNOSIS — Z6831 Body mass index (BMI) 31.0-31.9, adult: Secondary | ICD-10-CM | POA: Diagnosis not present

## 2022-10-23 ENCOUNTER — Ambulatory Visit
Admission: RE | Admit: 2022-10-23 | Discharge: 2022-10-23 | Disposition: A | Discharge status: Home / Self Care | Payer: Medicare HMO | Source: Ambulatory Visit | Attending: Obstetrics and Gynecology | Admitting: Obstetrics and Gynecology

## 2022-10-23 DIAGNOSIS — N6342 Unspecified lump in left breast, subareolar: Secondary | ICD-10-CM | POA: Diagnosis not present

## 2022-10-23 DIAGNOSIS — R9389 Abnormal findings on diagnostic imaging of other specified body structures: Secondary | ICD-10-CM

## 2022-10-23 DIAGNOSIS — N6323 Unspecified lump in the left breast, lower outer quadrant: Secondary | ICD-10-CM | POA: Diagnosis not present

## 2022-10-23 MED ORDER — GADOPICLENOL 0.5 MMOL/ML IV SOLN
9.0000 mL | Freq: Once | INTRAVENOUS | Status: AC | PRN
  Administered 2022-10-23: 9 mL via INTRAVENOUS

## 2022-11-08 DIAGNOSIS — N6452 Nipple discharge: Secondary | ICD-10-CM | POA: Diagnosis not present

## 2023-01-14 ENCOUNTER — Ambulatory Visit: Payer: Medicare HMO | Attending: Cardiovascular Disease | Admitting: Cardiovascular Disease

## 2023-01-14 ENCOUNTER — Encounter: Payer: Self-pay | Admitting: Cardiovascular Disease

## 2023-01-14 DIAGNOSIS — U071 COVID-19: Secondary | ICD-10-CM

## 2023-01-14 DIAGNOSIS — I471 Supraventricular tachycardia, unspecified: Secondary | ICD-10-CM | POA: Diagnosis not present

## 2023-01-14 DIAGNOSIS — Z8249 Family history of ischemic heart disease and other diseases of the circulatory system: Secondary | ICD-10-CM | POA: Diagnosis not present

## 2023-01-14 DIAGNOSIS — Z823 Family history of stroke: Secondary | ICD-10-CM

## 2023-01-14 DIAGNOSIS — R002 Palpitations: Secondary | ICD-10-CM

## 2023-01-14 DIAGNOSIS — E7800 Pure hypercholesterolemia, unspecified: Secondary | ICD-10-CM

## 2023-01-14 NOTE — Progress Notes (Signed)
Cardiology Office Note    Date:  01/20/2023   ID:  Pranita, Ara Feb 09, 1951, MRN [id number]  PCP:  Melida Quitter, MD  Cardiologist:  Nicki Guadalajara, MD   12 month F/U cardiology evaluation, initially referred to the courtesy of Dr. Paris Lore at [facility]   History of Present Illness:  Virginia [name] is a 72 y.o. female who was evaluated at [facility] emergency department after experiencing a presyncopal episode shortly after doing deep water aerobics.I saw Ms. Gwilt for her new cardiology evaluation on July 04, 2021 and last saw her on Jan 09, 2022.  She presents for a 1 year follow-up evaluation.   When I saw her for my initial evaluation, Mrs. Confair denied any known prior cardiac history.  She tested positive for COVID on August 14 and had symptoms of sinus pressure, sore throat, fever and cough.  She tested negative on August 27.  She had travel to [place] in [place] from September 14 through the 29th and did well with travel and was asymptomatic.  She denies any history of exertional chest pain or change in exercise tolerance.  She exercises regularly and does deep water aerobics at least 3 days/week for 60 minutes without difficulty.  On October 28 approximately 20 to 30 minutes after returning from her swim class, she had sudden onset of a feeling of palpitations and thought she was going to pass out.  There was vague chest pressure and hot flashes with some shortness of breath.  She presented to [facility] ER and on presentation all symptoms had resolved.  She noted mild heart rate irregularity for total of 20 minutes.  During that evaluation, her blood pressure was 109/78.  Her pulse initially was 106.  She was afebrile.  Laboratory was essentially normal.  Her ECG showed sinus tachycardia with poor anterior R wave progression.  Chest x-ray did not reveal any active cardiopulmonary disease.  A CT angio of her chest was performed which did not reveal any acute pulmonary embolus or  other acute abnormality.  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  During that evaluation her ECG showed sinus rhythm at 81 without abnormality.  With her recent COVID infection I recommended a 2D echo Doppler study for assessment of LV systolic and diastolic function.  I also recommended that she wear a Zio patch monitor for 14 days.  Repeat laboratory was recommended.  Ms. Aston underwent echo Doppler study on July 26, 2021 which showed an EF of 55 to 60%.  She had grade 1 diastolic dysfunction.  There was evidence for moderate posterior directed mitral regurgitation and it was felt that the mechanism of the MR was due to tethering of the posterior leaflet.  She wore a 2-week Zio patch monitor from November 13 through July 22, 2021.  The predominant rhythm was sinus rhythm with an average rate at 80 bpm.  The slowest rate was 54 bpm which occurred at 10:58 PM and fastest was sinus tachycardia which occurred at 7:49 AM.  There was atrial fibrillation with a 1% burden.  Longest interval lasted 3 hours and 29 minutes with an average rate at 132.  There were short-lived episodes of SVT with possible atrial tachycardia with variable block.  The fastest burst of SVT lasted 9 beats.  There were occasional PACs with rare couplets, rare atrial triplets and rare ventricular ectopy with an episode of  ventricular trigeminy.  I saw her on September 17, 2021 for follow-up evaluation.  At that time she felt well.  At her initial evaluation she was given a prescription for metoprolol tartrate 25 mg to take as needed. Following her Holter monitor, she had taken a dose of metoprolol to tartrate due to short sensations of heart pain getting on November 26, November 27, December 1, December 3, December 4, December 5, and on December 14.  She has seen her primary provider Dr. Donnald Garre while on September 06, 2021 and was stable.  She has noticed some  mild chest wall soreness which is musculoskeletal.  There is no exertional precipitation.  She is scheduled to undergo colonoscopy by Dr. Loreta Ave recent laboratory has shown an LDL cholesterol at 123.  She had normal renal function.  With her family history of heart disease I recommended she undergo a coronary calcium score.  At that time she was not on any lipid-lowering therapy.  I gave her clearance to undergo planned colonoscopy with Dr. Loreta Ave . I last saw her on Jan 09, 2022.  She just returned from a [place] cruise with her husband.  She had undergone colonoscopy and was found to have 1 polyp.  She has been doing water aerobics.  She denies chest pain or palpitations.  She underwent cardiac calcium score evaluation on October 01, 2021 and calcium score was 0.  She remains active.  She denies presyncope or syncope.  HEENT she continues to be on metoprolol succinate 25 mg daily and is not having any recurrent palpitations.  She also has a prescription for metoprolol to tartrate to take as needed.    Since I last saw her, she has continued to be very active.  She bikes regularly and does water aerobics at least 5 days/week.  She is unaware of any chest pain or shortness of breath.  She continues to be on metoprolol succinate 25 mg daily and has a as needed prescription for metoprolol to tartrate which she is not needed.  She continues to be on low-dose rosuvastatin 5 mg for hyperlipidemia.  She and her family have planned an extensive trip all family members to [place] in May 2025.  She presents for evaluation.   Past Medical History:  Diagnosis Date   Hypertension     Past Surgical History:  Procedure Laterality Date   uterine polyectomy      Current Medications: Outpatient Medications Prior to Visit  Medication Sig Dispense Refill   cholecalciferol (VITAMIN D3) 25 MCG (1000 UNIT) tablet 1 tablet     metoprolol succinate (TOPROL-XL) 25 MG 24 hr tablet TAKE 1 TABLET (25 MG TOTAL) BY MOUTH DAILY.  90 tablet 2   metoprolol tartrate (LOPRESSOR) 25 MG tablet Take one tablet daily as needed. 30 tablet 6   Oyster Shell Calcium 500 MG TABS 1 tablet with meals     conjugated estrogens (PREMARIN) vaginal cream USE VAGINALLY 2 TIMES A WEEK AS DIRECTED     FLUZONE HIGH-DOSE QUADRIVALENT 0.7 ML SUSY  (Patient not taking: Reported on 01/09/2022)     PFIZER COVID-19 VAC BIVALENT injection  (Patient not taking: Reported on 01/09/2022)     rosuvastatin (CRESTOR) 5 MG tablet Take 5 mg by mouth at bedtime.     No facility-administered medications prior to visit.     Allergies:   Sulfonamide derivatives   Social History   Socioeconomic History   Marital status: Married    Spouse name: Not on file   Number  of children: Not on file   Years of education: Not on file   Highest education level: Not on file  Occupational History   Not on file  Tobacco Use   Smoking status: Never   Smokeless tobacco: Never  Vaping Use   Vaping Use: Never used  Substance and Sexual Activity   Alcohol use: Never   Drug use: Never   Sexual activity: Not on file  Other Topics Concern   Not on file  Social History Narrative   Not on file   Social Determinants of Health   Financial Resource Strain: Not on file  Food Insecurity: Not on file  Transportation Needs: Not on file  Physical Activity: Not on file  Stress: Not on file  Social Connections: Not on file    Socially she was born in [place].  She is married for 45 years and has 2 children and 2 great-grandchildren.  There is no history of tobacco use and she does not drink alcohol.  She exercises regularly.  Family History: Her mother died at age 81 and had a stroke which reportedly was misdiagnosed as a brain tumor.  Her father died at age 68 with hypertension and a stroke.  She has 1 brother with mitral valve prolapse and has CAD status post stenting.  ROS General: Negative; No fevers, chills, or night sweats;  HEENT: Negative; No changes in vision  or hearing, sinus congestion, difficulty swallowing Pulmonary: Negative; No cough, wheezing, shortness of breath, hemoptysis Cardiovascular: See HPI GI: Negative; No nausea, vomiting, diarrhea, or abdominal pain GU: Negative; No dysuria, hematuria, or difficulty voiding Musculoskeletal: Negative; no myalgias, joint pain, or weakness Hematologic/Oncology: Negative; no easy bruising, bleeding Endocrine: Negative; no heat/cold intolerance; no diabetes Neuro: Negative; no changes in balance, headaches Skin: Negative; No rashes or skin lesions Psychiatric: Negative; No behavioral problems, depression Sleep: Negative; No snoring, daytime sleepiness, hypersomnolence, bruxism, restless legs, hypnogognic hallucinations, no cataplexy Other comprehensive 14 point system review is negative.   PHYSICAL EXAM:   VS:  BP 122/74   Pulse 64   Ht 5\' 4"  (1.626 m)   Wt 179 lb 12.8 oz (81.6 kg)   SpO2 98%   BMI 30.86 kg/m     Repeat blood pressure by me was 136/70  Wt Readings from Last 3 Encounters:  01/14/23 179 lb 12.8 oz (81.6 kg)  01/09/22 182 lb 3.2 oz (82.6 kg)  09/17/21 178 lb 12.8 oz (81.1 kg)    General: Alert, oriented, no distress.  Skin: normal turgor, no rashes, warm and dry HEENT: Normocephalic, atraumatic. Pupils equal round and reactive to light; sclera anicteric; extraocular muscles intact;  Nose without nasal septal hypertrophy Mouth/Parynx benign; Mallinpatti scale 3 Neck: No JVD, no carotid bruits; normal carotid upstroke Lungs: clear to ausculatation and percussion; no wheezing or rales Chest wall: without tenderness to palpitation Heart: PMI not displaced, RRR, s1 s2 normal, 1/6 systolic murmur, no diastolic murmur, no rubs, gallops, thrills, or heaves Abdomen: soft, nontender; no hepatosplenomehaly, BS+; abdominal aorta nontender and not dilated by palpation. Back: no CVA tenderness Pulses 2+ Musculoskeletal: full range of motion, normal strength, no joint  deformities Extremities: no clubbing cyanosis or edema, Homan's sign negative  Neurologic: grossly nonfocal; Cranial nerves grossly wnl Psychologic: Normal mood and affect   Studies/Labs Reviewed:   Jan 14, 2023 ECG (independently read by me): NSR at 64, no ectopy  Jan 09, 2022 ECG (independently read by me): NSR at 69, QS V1-4  September 17, 2021 ECG (  independently read by me): NSR at 98, no ectopy, normal intervals  July 04, 2021 ECG (independently read by me): NSR at 81; no ectopy, normal intervlas  Recent Labs:    Latest Ref Rng & Units 07/05/2021    8:38 AM 06/22/2021   12:05 PM  BMP  Glucose 70 - 99 mg/dL 94  308   BUN 8 - 27 mg/dL 13  14   Creatinine 6.57 - 1.00 mg/dL 8.46  9.62   BUN/Creat Ratio 12 - 28 15    Sodium 134 - 144 mmol/L 142  139   Potassium 3.5 - 5.2 mmol/L 4.3  3.9   Chloride 96 - 106 mmol/L 105  104   CO2 20 - 29 mmol/L 26  25   Calcium 8.7 - 10.3 mg/dL 9.4  9.7         Latest Ref Rng & Units 07/05/2021    8:38 AM  Hepatic Function  Total Protein 6.0 - 8.5 g/dL 6.5   Albumin 3.8 - 4.8 g/dL 4.1   AST 0 - 40 IU/L 14   ALT 0 - 32 IU/L 15   Alk Phosphatase 44 - 121 IU/L 105   Total Bilirubin 0.0 - 1.2 mg/dL 0.5        Latest Ref Rng & Units 06/22/2021   12:05 PM 06/05/2007   11:51 AM  CBC  WBC 4.0 - 10.5 K/uL 12.4  11.0   Hemoglobin 12.0 - 15.0 g/dL 95.2  84.1   Hematocrit 36.0 - 46.0 % 48.6  43.1   Platelets 150 - 400 K/uL 343  335    Lab Results  Component Value Date   MCV 93.3 06/22/2021   MCV 91.1 06/05/2007   No results found for: "TSH" No results found for: "HGBA1C"   BNP No results found for: "BNP"  ProBNP No results found for: "PROBNP"   Lipid Panel     Component Value Date/Time   CHOL 198 07/05/2021 0838   TRIG 94 07/05/2021 0838   HDL 58 07/05/2021 0838   CHOLHDL 3.4 07/05/2021 0838   LDLCALC 123 (H) 07/05/2021 0838   LABVLDL 17 07/05/2021 0838     RADIOLOGY: No results found.   Additional studies/  records that were reviewed today include:  I reviewed her records from her ER evaluation.    ASSESSMENT:    1. Paroxysmal SVT (supraventricular tachycardia)   2. Elevated LDL cholesterol level   3. Palpitations   4. Family history of coronary artery disease   5. Family history of stroke   6. COVID: August 14 - 20, 2022      PLAN:  Ms. Deangela Bogucki is a very pleasant 72 year old female who denied any known cardiac history.  She tested positive for COVID in August 2022 and had symptoms of sinus pressure, sore throat, short-term fever and cough.  Following a deep water aerobics class, approximately 20 minutes later she noticed heart rate irregularity and a sensation of vague heart pressure and hot flashes with associated presyncope.  Chest CT was negative for PE.  When I saw her for my initial evaluation on July 04, 2021 her blood pressure was mildly elevated and at times she stated her heart rate would increase to approximately 100 bpm.  Her ECG showed sinus rhythm at 81 bpm.  I described metoprolol to tartrate 25 mg to take on a as needed basis and recommended a echo Doppler assessment as well as 2-week Zio patch monitor.  I reviewed the findings of her  echo and monitor evaluations with she and her husband in detail.  She was found to predominant sinus rhythm and had several episodes of SVT with possible atrial tachycardia with variable block.  There was atrial fibrillation with 1% burden.  I reviewed her echo Doppler study which showed normal LV function with grade 1 diastolic dysfunction and average left-ventricular global longitudinal strain at -17.8%.  She had evidence for moderate posterior directed mitral regurgitation.  At her office visit in January 2023 I changed her from metoprolol to tartrate to metoprolol succinate and she has continued to have a prescription for metoprolol to tartrate to take as needed in the event of recurrent palpitations.  Presently, she continues to do well.  Her  blood pressure today is stable and she states her blood pressure at home typically runs around 120 systolic with diastolics around 72.  She remains active riding a bike and does deep water aerobics 5 days a week.  There is family history for cardiac disease in grandparents as well as history of stroke.  Clinically she is stable on current therapy.  I have suggested that next time laboratory be checked that she also has LP(a) checked with her laboratory to be done by Demetrios Loll at [facility].  She will be taking her family to [place] next May.  I will plan to see her in April 2025 prior to her trip for follow-up evaluation    Medication Adjustments/Labs and Tests Ordered: Current medicines are reviewed at length with the patient today.  Concerns regarding medicines are outlined above.  Medication changes, Labs and Tests ordered today are listed in the Patient Instructions below. Patient Instructions  Medication Instructions:  Your physician recommends that you continue on your current medications as directed. Please refer to the Current Medication list given to you today.  *If you need a refill on your cardiac medications before your next appointment, please call your pharmacy*   Lab Work: None    Testing/Procedures: None   Follow-Up: At [facility], you and your health needs are our priority.  As part of our continuing mission to provide you with exceptional heart care, we have created designated Provider Care Teams.  These Care Teams include your primary Cardiologist (physician) and Advanced Practice Providers (APPs -  Physician Assistants and Nurse Practitioners) who all work together to provide you with the care you need, when you need it.   Your next appointment:    April  Provider:   Nicki Guadalajara, MD      Other instructions: Have your PCP draw a Lp(a) and the Lipid panel as well.      Signed, Nicki Guadalajara, MD  01/20/2023 11:51 AM    [facility] [address], [place], [place]  [address] Phone: [phone]

## 2023-01-14 NOTE — Patient Instructions (Addendum)
Medication Instructions:  Your physician recommends that you continue on your current medications as directed. Please refer to the Current Medication list given to you today.  *If you need a refill on your cardiac medications before your next appointment, please call your pharmacy*   Lab Work: None    Testing/Procedures: None   Follow-Up: At [facility], you and your health needs are our priority.  As part of our continuing mission to provide you with exceptional heart care, we have created designated Provider Care Teams.  These Care Teams include your primary Cardiologist (physician) and Advanced Practice Providers (APPs -  Physician Assistants and Nurse Practitioners) who all work together to provide you with the care you need, when you need it.   Your next appointment:    April  Provider:   Nicki Guadalajara, MD      Other instructions: Have your PCP draw a Lp(a) and the Lipid panel as well.

## 2023-01-20 ENCOUNTER — Encounter: Payer: Self-pay | Admitting: Cardiovascular Disease

## 2023-02-01 ENCOUNTER — Other Ambulatory Visit: Payer: Self-pay | Admitting: Cardiovascular Disease

## 2023-03-19 DIAGNOSIS — E785 Hyperlipidemia, unspecified: Secondary | ICD-10-CM | POA: Diagnosis not present

## 2023-03-19 DIAGNOSIS — I10 Essential (primary) hypertension: Secondary | ICD-10-CM | POA: Diagnosis not present

## 2023-03-19 DIAGNOSIS — I471 Supraventricular tachycardia, unspecified: Secondary | ICD-10-CM | POA: Diagnosis not present

## 2023-06-23 IMAGING — CT CT CARDIAC CORONARY ARTERY CALCIUM SCORE
3 series · 14 of 20 positions shown, 16 images · non-contrast
Comparison: None.
COMPARISON: None.

Addendum:
EXAM:
OVER-READ INTERPRETATION  CT CHEST

The following report is an over-read performed by radiologist Dr.
Joung Kyu Kamara [REDACTED] on 10/01/2021. This
over-read does not include interpretation of cardiac or coronary
anatomy or pathology. The coronary calcium score interpretation by
the cardiologist is attached.
CLINICAL DATA: Cardiovascular Disease Risk stratification
Coronary Calcium Score
TECHNIQUE: A gated, non-contrast computed tomography scan of the heart was
performed using 3mm slice thickness. Axial images were analyzed on a
dedicated workstation. Calcium scoring of the coronary arteries was
performed using the Agatston method.

[Series 2: ax lung · axial · 0.77mm/px · z∈[+97,+209]mm · 5 of 86 slices shown]
[im 15/86  lung]
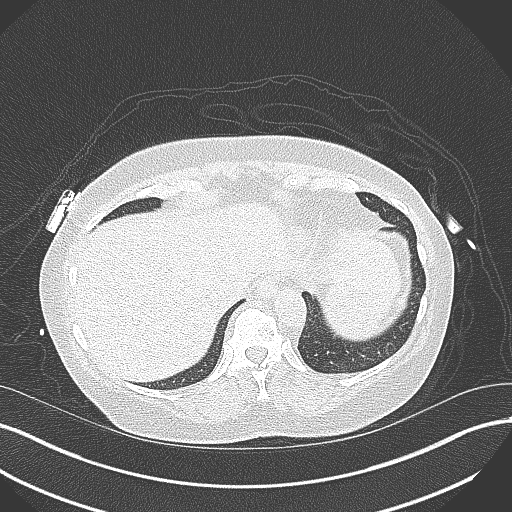
[im 29/86  lung]
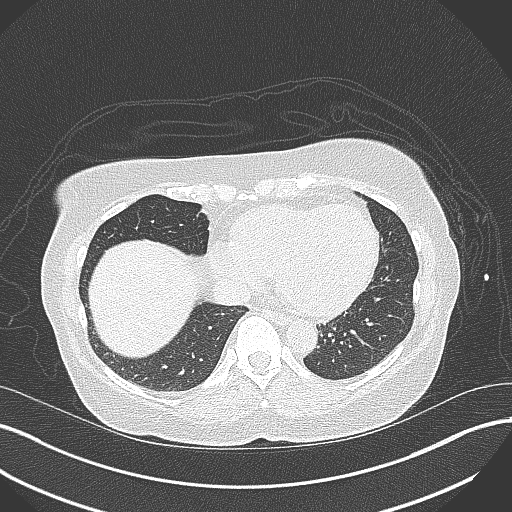
[im 43/86  lung]
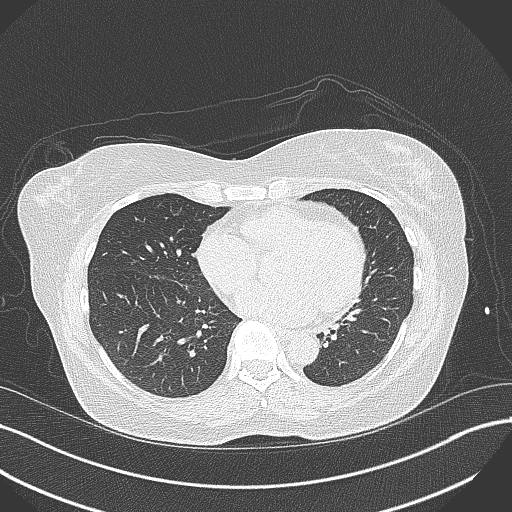
[im 57/86  lung]
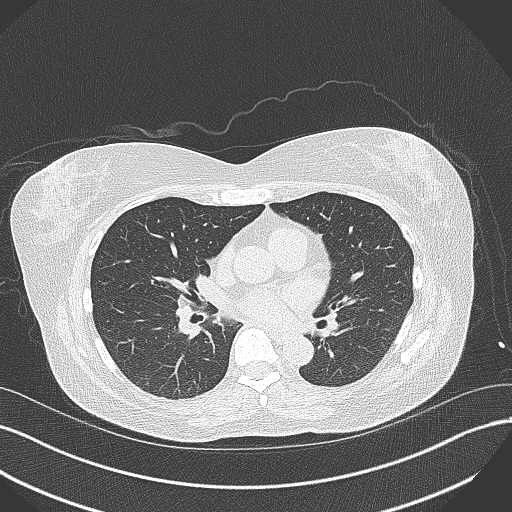
[im 71/86  lung]
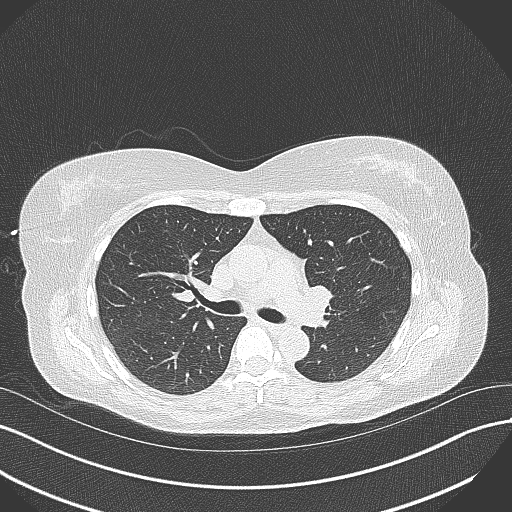

[Series 3: cascseq 3.0 sa36 70% (id) · axial · 0.35mm/px · z∈[+112,+196]mm · 3 of 57 slices shown]
[im 15/57  vessel]
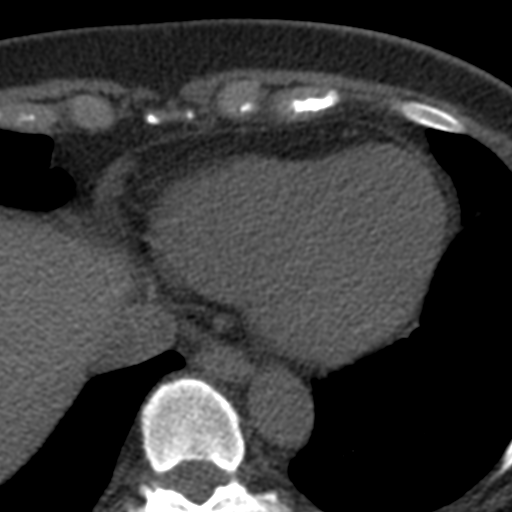
[im 29/57  vessel]
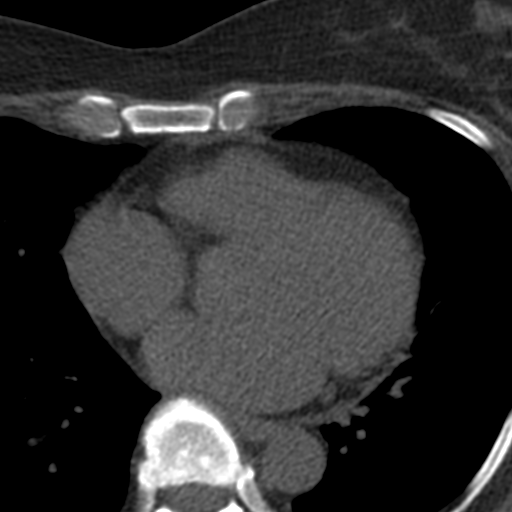
[im 43/57  vessel]
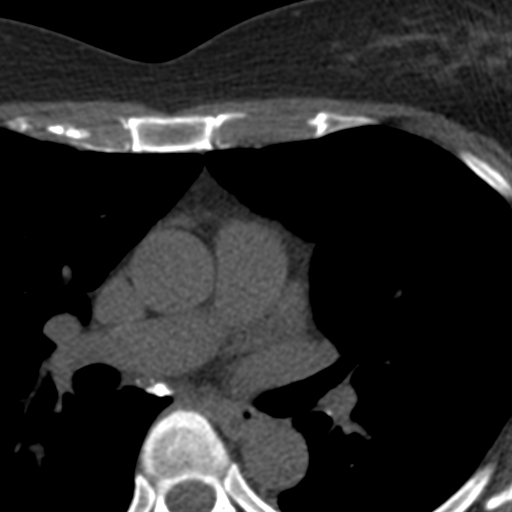

[Series 4: ax st · axial · 0.77mm/px · z∈[+93,+213]mm · 6 of 86 slices shown, 8 images]
[im 13/86  vessel]
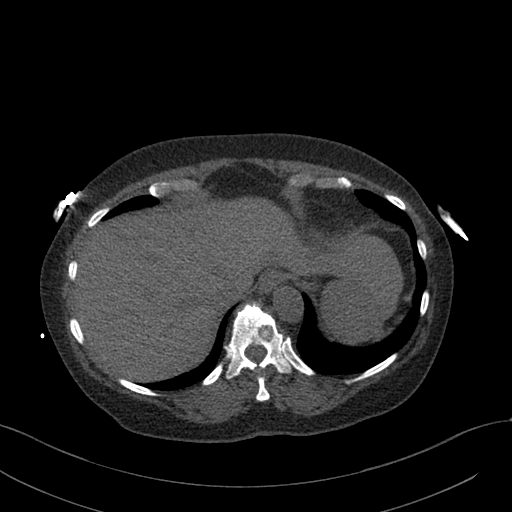
[im 13/86  lung]
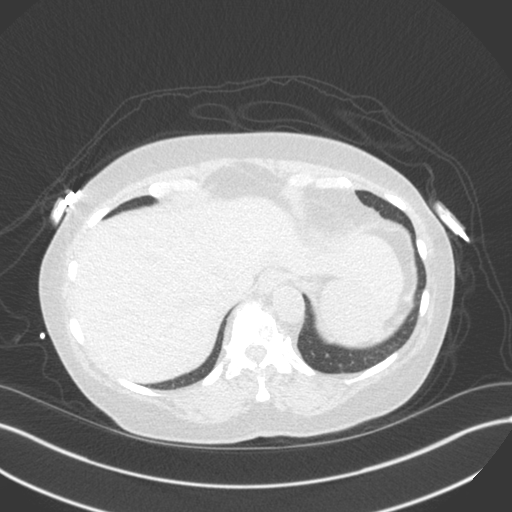
[im 25/86  vessel]
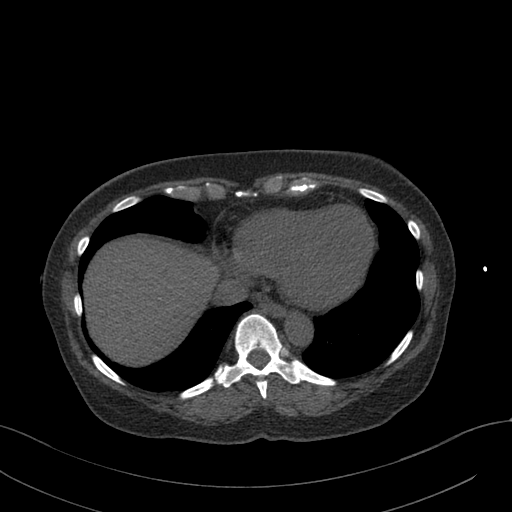
[im 37/86  vessel]
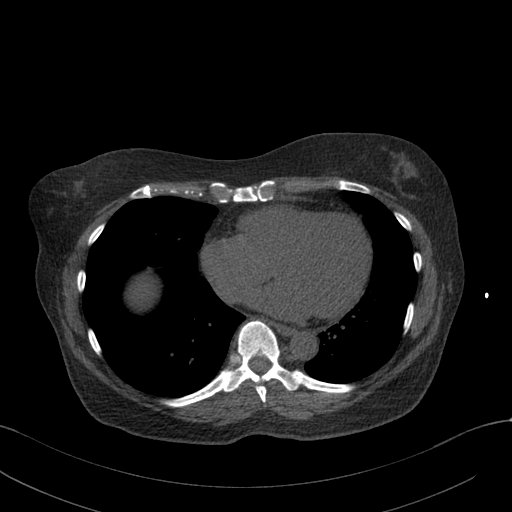
[im 49/86  vessel]
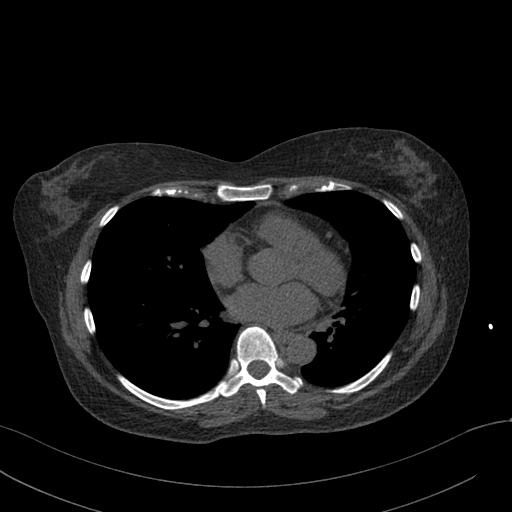
[im 61/86  vessel]
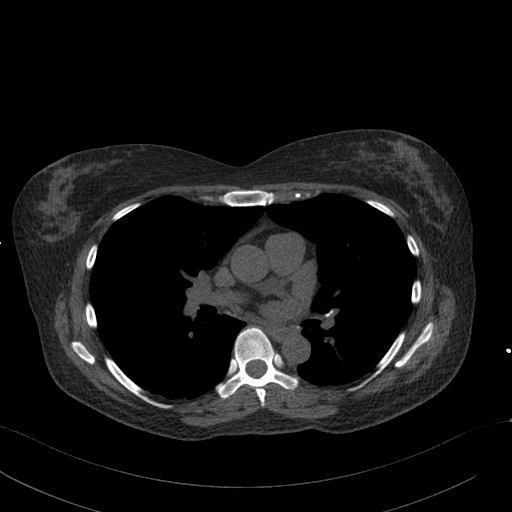
[im 61/86  lung]
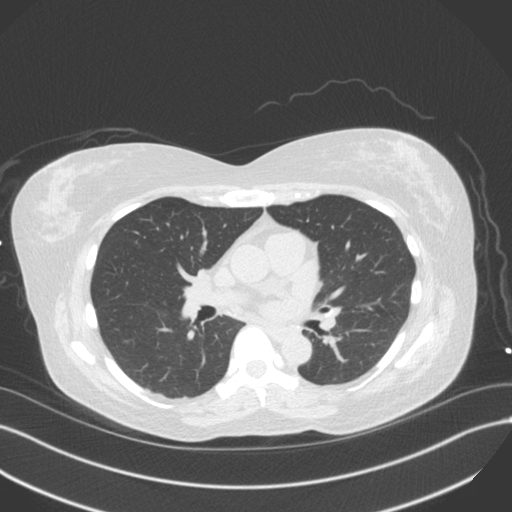
[im 73/86  vessel]
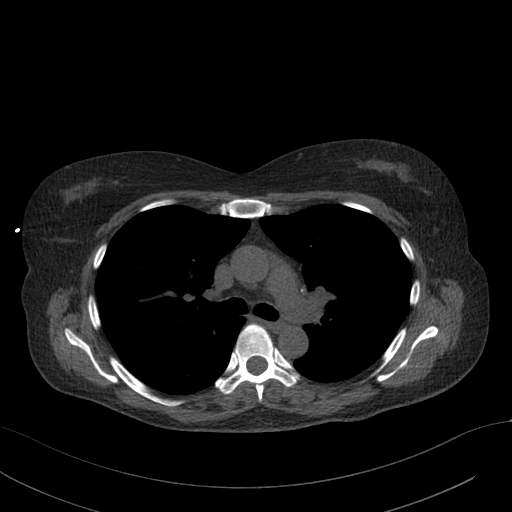

[14 of 20 positions shown; findings below may reference images not displayed]

FINDINGS: Within the visualized portions of the thorax there are no suspicious
appearing pulmonary nodules or masses, there is no acute
consolidative airspace disease, no pleural effusions, no
pneumothorax and no lymphadenopathy. Visualized portions of the
upper abdomen demonstrates some tiny calcified granulomas in the
spleen. There are no aggressive appearing lytic or blastic lesions
noted in the visualized portions of the skeleton.
IMPRESSION: No significant incidental noncardiac findings are noted.
FINDINGS: Coronary arteries: Normal origins.

Coronary Calcium Score:

Left main: 0

Left anterior descending artery: 0

Left circumflex artery: 0

Right coronary artery: 0

Total: 0

Percentile: 0

Pericardium: Normal.

Ascending Aorta: Normal caliber.

Non-cardiac: See separate report from [REDACTED].
IMPRESSION: Coronary calcium score of 0. This was 0 percentile for age-, race-,
and sex-matched controls.



If CAC=0, it is reasonable to withhold statin therapy and reassess
in 5 to 10 years, as long as higher risk conditions are absent
(diabetes mellitus, family history of premature CHD in first degree
relatives (males <55 years; females <65 years), cigarette smoking,
or LDL >=190 mg/dL).

If CAC is 1 to 99, it is reasonable to initiate statin therapy for
patients >=55 years of age.

If CAC is >=100 or >=75th percentile, it is reasonable to initiate
statin therapy at any age.

Cardiology referral should be considered for patients with CAC
scores >=400 or >=75th percentile.

*7281 AHA/ACC/AACVPR/AAPA/ABC/JOH/RONLOR/BELEN/Tiger/MUNGUIA/CALLEN/BEXITA
Guideline on the Management of Blood Cholesterol: A Report of the
American College of Cardiology/American Heart Association Task Force
on Clinical Practice Guidelines. J Am Coll Cardiol.
9815;73(24):9975-9791.

*** End of Addendum ***
EXAM:
OVER-READ INTERPRETATION  CT CHEST

The following report is an over-read performed by radiologist Dr.
Joung Kyu Kamara [REDACTED] on 10/01/2021. This
over-read does not include interpretation of cardiac or coronary
anatomy or pathology. The coronary calcium score interpretation by
the cardiologist is attached.
FINDINGS: Within the visualized portions of the thorax there are no suspicious
appearing pulmonary nodules or masses, there is no acute
consolidative airspace disease, no pleural effusions, no
pneumothorax and no lymphadenopathy. Visualized portions of the
upper abdomen demonstrates some tiny calcified granulomas in the
spleen. There are no aggressive appearing lytic or blastic lesions
noted in the visualized portions of the skeleton.
IMPRESSION: No significant incidental noncardiac findings are noted.

## 2023-09-08 ENCOUNTER — Other Ambulatory Visit: Payer: Self-pay | Admitting: Adult Health

## 2023-09-08 DIAGNOSIS — I10 Essential (primary) hypertension: Secondary | ICD-10-CM | POA: Diagnosis not present

## 2023-09-08 DIAGNOSIS — R3129 Other microscopic hematuria: Secondary | ICD-10-CM | POA: Diagnosis not present

## 2023-09-08 DIAGNOSIS — N200 Calculus of kidney: Secondary | ICD-10-CM

## 2023-09-08 DIAGNOSIS — M545 Low back pain, unspecified: Secondary | ICD-10-CM | POA: Diagnosis not present

## 2023-09-08 DIAGNOSIS — N390 Urinary tract infection, site not specified: Secondary | ICD-10-CM | POA: Diagnosis not present

## 2023-09-08 DIAGNOSIS — R109 Unspecified abdominal pain: Secondary | ICD-10-CM | POA: Diagnosis not present

## 2023-09-09 ENCOUNTER — Other Ambulatory Visit: Payer: Medicare HMO

## 2023-09-09 ENCOUNTER — Ambulatory Visit
Admission: RE | Admit: 2023-09-09 | Discharge: 2023-09-09 | Discharge status: Home / Self Care | Payer: Medicare HMO | Source: Ambulatory Visit | Attending: Adult Health

## 2023-09-09 DIAGNOSIS — N200 Calculus of kidney: Secondary | ICD-10-CM

## 2023-09-09 DIAGNOSIS — K5730 Diverticulosis of large intestine without perforation or abscess without bleeding: Secondary | ICD-10-CM | POA: Diagnosis not present

## 2023-09-09 DIAGNOSIS — R109 Unspecified abdominal pain: Secondary | ICD-10-CM | POA: Diagnosis not present

## 2023-09-17 DIAGNOSIS — E785 Hyperlipidemia, unspecified: Secondary | ICD-10-CM | POA: Diagnosis not present

## 2023-09-24 DIAGNOSIS — Z0000 Encounter for general adult medical examination without abnormal findings: Secondary | ICD-10-CM | POA: Diagnosis not present

## 2023-09-24 DIAGNOSIS — I10 Essential (primary) hypertension: Secondary | ICD-10-CM | POA: Diagnosis not present

## 2023-09-24 DIAGNOSIS — M545 Low back pain, unspecified: Secondary | ICD-10-CM | POA: Diagnosis not present

## 2023-09-24 DIAGNOSIS — I471 Supraventricular tachycardia, unspecified: Secondary | ICD-10-CM | POA: Diagnosis not present

## 2023-09-24 DIAGNOSIS — Z1331 Encounter for screening for depression: Secondary | ICD-10-CM | POA: Diagnosis not present

## 2023-09-24 DIAGNOSIS — E785 Hyperlipidemia, unspecified: Secondary | ICD-10-CM | POA: Diagnosis not present

## 2023-09-24 DIAGNOSIS — Z7184 Encounter for health counseling related to travel: Secondary | ICD-10-CM | POA: Diagnosis not present

## 2023-09-24 DIAGNOSIS — Z1339 Encounter for screening examination for other mental health and behavioral disorders: Secondary | ICD-10-CM | POA: Diagnosis not present

## 2023-10-02 DIAGNOSIS — M545 Low back pain, unspecified: Secondary | ICD-10-CM | POA: Diagnosis not present

## 2023-10-06 DIAGNOSIS — M545 Low back pain, unspecified: Secondary | ICD-10-CM | POA: Diagnosis not present

## 2023-10-13 DIAGNOSIS — M545 Low back pain, unspecified: Secondary | ICD-10-CM | POA: Diagnosis not present

## 2023-10-16 DIAGNOSIS — M545 Low back pain, unspecified: Secondary | ICD-10-CM | POA: Diagnosis not present

## 2023-10-17 DIAGNOSIS — Z01419 Encounter for gynecological examination (general) (routine) without abnormal findings: Secondary | ICD-10-CM | POA: Diagnosis not present

## 2023-10-17 DIAGNOSIS — Z1231 Encounter for screening mammogram for malignant neoplasm of breast: Secondary | ICD-10-CM | POA: Diagnosis not present

## 2023-10-17 DIAGNOSIS — Z6830 Body mass index (BMI) 30.0-30.9, adult: Secondary | ICD-10-CM | POA: Diagnosis not present

## 2023-10-20 DIAGNOSIS — M545 Low back pain, unspecified: Secondary | ICD-10-CM | POA: Diagnosis not present

## 2023-10-21 ENCOUNTER — Ambulatory Visit: Payer: Medicare HMO | Admitting: Cardiovascular Disease

## 2023-11-06 ENCOUNTER — Other Ambulatory Visit: Payer: Self-pay | Admitting: Cardiovascular Disease

## 2023-11-13 DIAGNOSIS — H1045 Other chronic allergic conjunctivitis: Secondary | ICD-10-CM | POA: Diagnosis not present

## 2023-11-13 DIAGNOSIS — D3132 Benign neoplasm of left choroid: Secondary | ICD-10-CM | POA: Diagnosis not present

## 2023-11-13 DIAGNOSIS — H04123 Dry eye syndrome of bilateral lacrimal glands: Secondary | ICD-10-CM | POA: Diagnosis not present

## 2023-11-13 DIAGNOSIS — H26492 Other secondary cataract, left eye: Secondary | ICD-10-CM | POA: Diagnosis not present

## 2023-11-13 DIAGNOSIS — H43813 Vitreous degeneration, bilateral: Secondary | ICD-10-CM | POA: Diagnosis not present

## 2023-11-14 ENCOUNTER — Encounter: Payer: Self-pay | Admitting: Cardiovascular Disease

## 2023-11-14 ENCOUNTER — Ambulatory Visit: Payer: Medicare HMO | Attending: Cardiovascular Disease | Admitting: Cardiovascular Disease

## 2023-11-14 DIAGNOSIS — R002 Palpitations: Secondary | ICD-10-CM | POA: Diagnosis not present

## 2023-11-14 DIAGNOSIS — U071 COVID-19: Secondary | ICD-10-CM | POA: Diagnosis not present

## 2023-11-14 DIAGNOSIS — Z823 Family history of stroke: Secondary | ICD-10-CM

## 2023-11-14 DIAGNOSIS — Z8249 Family history of ischemic heart disease and other diseases of the circulatory system: Secondary | ICD-10-CM

## 2023-11-14 DIAGNOSIS — I471 Supraventricular tachycardia, unspecified: Secondary | ICD-10-CM

## 2023-11-14 DIAGNOSIS — E7800 Pure hypercholesterolemia, unspecified: Secondary | ICD-10-CM | POA: Diagnosis not present

## 2023-11-14 MED ORDER — METOPROLOL SUCCINATE ER 25 MG PO TB24: 37.5000 mg | ORAL_TABLET | Freq: Every day | ORAL | 3 refills | Status: AC

## 2023-11-14 NOTE — Patient Instructions (Signed)
 Medication Instructions:  Increase the Metoprolol Succinate to 37.5mg . Take this medication daily. *If you need a refill on your cardiac medications before your next appointment, please call your pharmacy*   Lab Work: No labs were ordered during today's visit.  If you have labs (blood work) drawn today and your tests are completely normal, you will receive your results only by: MyChart Message (if you have MyChart) OR A paper copy in the mail If you have any lab test that is abnormal or we need to change your treatment, we will call you to review the results.   Testing/Procedures: No procedures were ordered during today's visit.    Follow-Up: At [facility], you and your health needs are our priority.  As part of our continuing mission to provide you with exceptional heart care, we have created designated Provider Care Teams.  These Care Teams include your primary Cardiologist (physician) and Advanced Practice Providers (APPs -  Physician Assistants and Nurse Practitioners) who all work together to provide you with the care you need, when you need it.  We recommend signing up for the patient portal called "MyChart".  Sign up information is provided on this After Visit Summary.  MyChart is used to connect with patients for Virtual Visits (Telemedicine).  Patients are able to view lab/test results, encounter notes, upcoming appointments, etc.  Non-urgent messages can be sent to your provider as well.   To learn more about what you can do with MyChart, go to [email or web address].    Your next appointment:   1 year(s)  Provider:   Jodelle Red, MD   Other Instructions Thank you for choosing [facility]!

## 2023-11-14 NOTE — Progress Notes (Signed)
 Cardiology Office Note    Date:  11/16/2023   ID:  Virginia [name] 12-21-1950, MRN [id number]  PCP:  Melida Quitter, MD  Cardiologist:  Nicki Guadalajara, MD   10 month F/U cardiology evaluation, initially referred to the courtesy of Dr. Paris Lore at [facility]   History of Present Illness:  Virginia [name] is a 73 y.o. female who was evaluated at [facility] emergency department after experiencing a presyncopal episode shortly after doing deep water aerobics.I saw Virginia [name] for her new cardiology evaluation on July 04, 2021 and last saw her on Jan 09, 2022.  She presents for a 1 year follow-up evaluation.   When I saw her for my initial evaluation, Virginia [name] denied any known prior cardiac history.  She tested positive for COVID on August 14 and had symptoms of sinus pressure, sore throat, fever and cough.  She tested negative on August 27.  She had travel to [place] in [place] from September 14 through the 29th and did well with travel and was asymptomatic.  She denies any history of exertional chest pain or change in exercise tolerance.  She exercises regularly and does deep water aerobics at least 3 days/week for 60 minutes without difficulty.  On October 28 approximately 20 to 30 minutes after returning from her swim class, she had sudden onset of a feeling of palpitations and thought she was going to pass out.  There was vague chest pressure and hot flashes with some shortness of breath.  She presented to [facility] ER and on presentation all symptoms had resolved.  She noted mild heart rate irregularity for total of 20 minutes.  During that evaluation, her blood pressure was 109/78.  Her [name] initially was 106.  She was afebrile.  Laboratory was essentially normal.  Her ECG showed sinus tachycardia with poor anterior R wave progression.  Chest x-ray did not reveal any active cardiopulmonary disease.  A CT angio of her chest was performed which did not reveal any acute pulmonary  embolus or other acute abnormality.  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  During that evaluation her ECG showed sinus rhythm at 81 without abnormality.  With her recent COVID infection I recommended a 2D echo Doppler study for assessment of LV systolic and diastolic function.  I also recommended that she wear a Zio patch monitor for 14 days.  Repeat laboratory was recommended.  She underwent an echo Doppler study on July 26, 2021 which showed an EF of 55 to 60%.  She had grade 1 diastolic dysfunction.  There was evidence for moderate posterior directed mitral regurgitation and it was felt that the mechanism of the MR was due to tethering of the posterior leaflet.  She wore a 2-week Zio patch monitor from November 13 through July 22, 2021.  The predominant rhythm was sinus rhythm with an average rate at 80 bpm.  The slowest rate was 54 bpm which occurred at 10:58 PM and fastest was sinus tachycardia which occurred at 7:49 AM.  There was atrial fibrillation with a 1% burden.  Longest interval lasted 3 hours and 29 minutes with an average rate at 132.  There were short-lived episodes of SVT with possible atrial tachycardia with variable block.  The fastest burst of SVT lasted 9 beats.  There were occasional PACs with rare couplets, rare atrial triplets and rare ventricular  ectopy with an episode of ventricular trigeminy.  I saw her on September 17, 2021 for follow-up evaluation.  At that time she felt well.  At her initial evaluation she was given a prescription for metoprolol tartrate 25 mg to take as needed. Following her Holter monitor, she had taken a dose of metoprolol to tartrate due to short sensations of heart pain getting on November 26, November 27, December 1, December 3, December 4, December 5, and on December 14.  She has seen her primary provider Dr. Donnald Garre while on September 06, 2021 and was stable.  She has  noticed some mild chest wall soreness which is musculoskeletal.  There is no exertional precipitation.  She is scheduled to undergo colonoscopy by Dr. Loreta Ave recent laboratory has shown an LDL cholesterol at 123.  She had normal renal function.  With her family history of heart disease I recommended she undergo a coronary calcium score.  At that time she was not on any lipid-lowering therapy.  I gave her clearance to undergo planned colonoscopy with Dr. Loreta Ave . When I saw her on Jan 09, 2022 she just returned from a [place] cruise with her husband.  She had undergone colonoscopy and was found to have 1 polyp.  She has been doing water aerobics.  She denies chest pain or palpitations.  She underwent cardiac calcium score evaluation on October 01, 2021 and calcium score was 0.  She remains active.  She denies presyncope or syncope.  HEENT she continues to be on metoprolol succinate 25 mg daily and is not having any recurrent palpitations.  She also has a prescription for metoprolol to tartrate to take as needed.    I last saw her on Jan 14, 2023 at which time she continued to be very active.  She was biking regularly and was doing  water aerobics at least 5 days/week.  She was unaware of any chest pain or shortness of breath.  She was on metoprolol succinate 25 mg daily and has a as needed prescription for metoprolol to tartrate which she is not needed.  She continues to be on low-dose rosuvastatin 5 mg for hyperlipidemia.  She and her family have planned an extensive trip with all family members to [place] in May 2025.    Since I last saw her, she and her family had gone on her safari trip to [place] from March 3 - 16, 2025 with [employer].  Her trip was excellent.  She denied any chest pain or shortness of breath.  She is completing her malaria prophylaxis treatment and has 2 more days left.  She continues to be on metoprolol succinate 25 mg and has a as needed prescription for tartrate.  She is on  low-dose rosuvastatin at 5 mg.  She has had rare increased heartbeats.  She presents for evaluation.   Past Medical History:  Diagnosis Date   Hypertension     Past Surgical History:  Procedure Laterality Date   uterine polyectomy      Current Medications: Outpatient Medications Prior to Visit  Medication Sig Dispense Refill   atovaquone-proguanil (MALARONE) 250-100 MG TABS tablet Take 1 tablet by mouth daily. Take 1 Tablet Daily until 3/22     cholecalciferol (VITAMIN D3) 25 MCG (1000 UNIT) tablet 1 tablet     metoprolol tartrate (LOPRESSOR) 25 MG tablet Take one tablet daily as needed. 30 tablet 6   Oyster Shell Calcium 500 MG TABS 1 tablet with meals     PFIZER  COVID-19 VAC BIVALENT injection      rosuvastatin (CRESTOR) 5 MG tablet Take 5 mg by mouth at bedtime.     metoprolol succinate (TOPROL-XL) 25 MG 24 hr tablet TAKE 1 TABLET (25 MG TOTAL) BY MOUTH DAILY. 90 tablet 0   FLUZONE HIGH-DOSE QUADRIVALENT 0.7 ML SUSY  (Patient not taking: Reported on 11/14/2023)     conjugated estrogens (PREMARIN) vaginal cream USE VAGINALLY 2 TIMES A WEEK AS DIRECTED     No facility-administered medications prior to visit.     Allergies:   Sulfonamide derivatives   Social History   Socioeconomic History   Marital status: Married    Spouse name: Not on file   Number of children: Not on file   Years of education: Not on file   Highest education level: Not on file  Occupational History   Not on file  Tobacco Use   Smoking status: Never   Smokeless tobacco: Never  Vaping Use   Vaping status: Never Used  Substance and Sexual Activity   Alcohol use: Never   Drug use: Never   Sexual activity: Not on file  Other Topics Concern   Not on file  Social History Narrative   Not on file   Social Drivers of Health   Financial Resource Strain: Not on file  Food Insecurity: Not on file  Transportation Needs: Not on file  Physical Activity: Not on file  Stress: Not on file  Social  Connections: Not on file    Socially she was born in [place].  She is married for 45 years and has 2 children and 2 great-grandchildren.  There is no history of tobacco use and she does not drink alcohol.  She exercises regularly.  Family History: Her mother died at age 33 and had a stroke which reportedly was misdiagnosed as a brain tumor.  Her father died at age 31 with hypertension and a stroke.  She has 1 brother with mitral valve prolapse and has CAD status post stenting.  ROS General: Negative; No fevers, chills, or night sweats;  HEENT: Negative; No changes in vision or hearing, sinus congestion, difficulty swallowing Pulmonary: Negative; No cough, wheezing, shortness of breath, hemoptysis Cardiovascular: See HPI GI: Negative; No nausea, vomiting, diarrhea, or abdominal pain GU: Negative; No dysuria, hematuria, or difficulty voiding Musculoskeletal: Negative; no myalgias, joint pain, or weakness Hematologic/Oncology: Negative; no easy bruising, bleeding Endocrine: Negative; no heat/cold intolerance; no diabetes Neuro: Negative; no changes in balance, headaches Skin: Negative; No rashes or skin lesions Psychiatric: Negative; No behavioral problems, depression Sleep: Negative; No snoring, daytime sleepiness, hypersomnolence, bruxism, restless legs, hypnogognic hallucinations, no cataplexy Other comprehensive 14 point system review is negative.   PHYSICAL EXAM:   VS:  BP 126/74   [name] 73   Ht 5\' 4"  (1.626 m)   Wt 174 lb 9.6 oz (79.2 kg)   SpO2 99%   BMI 29.97 kg/m     Repeat blood pressure by me was 134/76  Wt Readings from Last 3 Encounters:  11/14/23 174 lb 9.6 oz (79.2 kg)  01/14/23 179 lb 12.8 oz (81.6 kg)  01/09/22 182 lb 3.2 oz (82.6 kg)    General: Alert, oriented, no distress.  Skin: normal turgor, no rashes, warm and dry HEENT: Normocephalic, atraumatic. Pupils equal round and reactive to light; sclera anicteric; extraocular muscles intact;  Nose without  nasal septal hypertrophy Mouth/Parynx benign; Mallinpatti scale 3 Neck: No JVD, no carotid bruits; normal carotid upstroke Lungs: clear to ausculatation and percussion;  no wheezing or rales Chest wall: without tenderness to palpitation Heart: PMI not displaced, RRR, s1 s2 normal, 1/6 systolic murmur, no diastolic murmur, no rubs, gallops, thrills, or heaves Abdomen: soft, nontender; no hepatosplenomehaly, BS+; abdominal aorta nontender and not dilated by palpation. Back: no CVA tenderness Pulses 2+ Musculoskeletal: full range of motion, normal strength, no joint deformities Extremities: no clubbing cyanosis or edema, Homan's sign negative  Neurologic: grossly nonfocal; Cranial nerves grossly wnl Psychologic: Normal mood and affect    Studies/Labs Reviewed:   EKG Interpretation Date/Time:  Friday November 14 2023 08:37:11 EDT Ventricular Rate:  73 PR Interval:  168 QRS Duration:  80 QT Interval:  392 QTC Calculation: 431 R Axis:   74  Text Interpretation: Normal sinus rhythm Normal ECG When compared with ECG of 22-Jun-2021 11:35, Vent. rate has decreased BY  64 BPM Confirmed by Nicki Guadalajara ([id number]) on 11/16/2023 10:03:55 AM    Jan 14, 2023 ECG (independently read by me): NSR at 64, no ectopy  Jan 09, 2022 ECG (independently read by me): NSR at 69, QS V1-4  September 17, 2021 ECG (independently read by me): NSR at 98, no ectopy, normal intervals  July 04, 2021 ECG (independently read by me): NSR at 81; no ectopy, normal intervlas  Recent Labs:    Latest Ref Rng & Units 07/05/2021    8:38 AM 06/22/2021   12:05 PM  BMP  Glucose 70 - 99 mg/dL 94  696   BUN 8 - 27 mg/dL 13  14   Creatinine 2.95 - 1.00 mg/dL 2.84  1.32   BUN/Creat Ratio 12 - 28 15    Sodium 134 - 144 mmol/L 142  139   Potassium 3.5 - 5.2 mmol/L 4.3  3.9   Chloride 96 - 106 mmol/L 105  104   CO2 20 - 29 mmol/L 26  25   Calcium 8.7 - 10.3 mg/dL 9.4  9.7         Latest Ref Rng & Units 07/05/2021    8:38 AM   Hepatic Function  Total Protein 6.0 - 8.5 g/dL 6.5   Albumin 3.8 - 4.8 g/dL 4.1   AST 0 - 40 IU/L 14   ALT 0 - 32 IU/L 15   Alk Phosphatase 44 - 121 IU/L 105   Total Bilirubin 0.0 - 1.2 mg/dL 0.5        Latest Ref Rng & Units 06/22/2021   12:05 PM 06/05/2007   11:51 AM  CBC  WBC 4.0 - 10.5 K/uL 12.4  11.0   Hemoglobin 12.0 - 15.0 g/dL 44.0  10.2   Hematocrit 36.0 - 46.0 % 48.6  43.1   Platelets 150 - 400 K/uL 343  335    Lab Results  Component Value Date   MCV 93.3 06/22/2021   MCV 91.1 06/05/2007   No results found for: "TSH" No results found for: "HGBA1C"   BNP No results found for: "BNP"  ProBNP No results found for: "PROBNP"   Lipid Panel     Component Value Date/Time   CHOL 198 07/05/2021 0838   TRIG 94 07/05/2021 0838   HDL 58 07/05/2021 0838   CHOLHDL 3.4 07/05/2021 0838   LDLCALC 123 (H) 07/05/2021 0838   LABVLDL 17 07/05/2021 0838     RADIOLOGY: No results found.   Additional studies/ records that were reviewed today include:  I reviewed her records from her ER evaluation.    ASSESSMENT:    1. Paroxysmal SVT (supraventricular tachycardia) (HCC)  2. Palpitations   3. Elevated LDL cholesterol level   4. Family history of stroke   5. Family history of coronary artery disease   6. COVID: August 14 - 20, 2022     PLAN:  Virginia [name] is a very pleasant 73 year old female who denied any known cardiac history.  She tested positive for COVID in August 2022 and had symptoms of sinus pressure, sore throat, short-term fever and cough.  Following a deep water aerobics class, approximately 20 minutes later she noticed heart rate irregularity and a sensation of vague heart pressure and hot flashes with associated presyncope.  Chest CT was negative for PE.  When I saw her for my initial evaluation on July 04, 2021 her blood pressure was mildly elevated and at times she stated her heart rate would increase to approximately 100 bpm.  Her ECG showed  sinus rhythm at 81 bpm.  I described metoprolol to tartrate 25 mg to take on a as needed basis and recommended a echo Doppler assessment as well as 2-week Zio patch monitor.  I reviewed the findings of her echo and monitor evaluations with she and her husband in detail.  She was found to predominant sinus rhythm and had several episodes of SVT with possible atrial tachycardia with variable block.  There was atrial fibrillation with 1% burden.  Her echo Doppler study  showed normal LV function with grade 1 diastolic dysfunction and average left-ventricular global longitudinal strain at -17.8%.  She had evidence for moderate posterior directed mitral regurgitation.  At her office visit in January 2023 I changed her from metoprolol tartrate to metoprolol succinate and she has continued to have a prescription for metoprolol tartrate to take as needed in the event of recurrent palpitations.  She has continued to do well.  Her blood pressure today by me was 134/76 she has remained active and thoroughly enjoyed her safari trip to [place].  I have suggested slight titration of metoprolol succinate to 37.5 mg daily in light of her documented prior SVT with possible atrial tachycardia and palpitations.  Most recent laboratory on September 17, 2023 showed total cholesterol 125 LDL 61 triglycerides 76 and HDL 49 on her current regimen of low-dose rosuvastatin 5 mg.  I had suggested in the past that she have LP(a) checked by her primary physician when laboratory was checked, but I am not certain if this was done.  I discussed with her my plans for upcoming retirement.  I will transition her to the care of Dr. Janne Napoleon at our [facility] and will schedule her for 1 year evaluation.    Medication Adjustments/Labs and Tests Ordered: Current medicines are reviewed at length with the patient today.  Concerns regarding medicines are outlined above.  Medication changes, Labs and Tests ordered today are listed in  the Patient Instructions below. Patient Instructions  Medication Instructions:  Increase the Metoprolol Succinate to 37.5mg . Take this medication daily. *If you need a refill on your cardiac medications before your next appointment, please call your pharmacy*   Lab Work: No labs were ordered during today's visit.  If you have labs (blood work) drawn today and your tests are completely normal, you will receive your results only by: MyChart Message (if you have MyChart) OR A paper copy in the mail If you have any lab test that is abnormal or we need to change your treatment, we will call you to review the results.   Testing/Procedures: No procedures were ordered during today's visit.  Follow-Up: At [facility], you and your health needs are our priority.  As part of our continuing mission to provide you with exceptional heart care, we have created designated Provider Care Teams.  These Care Teams include your primary Cardiologist (physician) and Advanced Practice Providers (APPs -  Physician Assistants and Nurse Practitioners) who all work together to provide you with the care you need, when you need it.  We recommend signing up for the patient portal called "MyChart".  Sign up information is provided on this After Visit Summary.  MyChart is used to connect with patients for Virtual Visits (Telemedicine).  Patients are able to view lab/test results, encounter notes, upcoming appointments, etc.  Non-urgent messages can be sent to your provider as well.   To learn more about what you can do with MyChart, go to [email or web address].    Your next appointment:   1 year(s)  Provider:   Jodelle Red, MD   Other Instructions Thank you for choosing [facility]!       Signed, Nicki Guadalajara, MD  11/16/2023 10:12 AM    [facility] [address], [address], [place], [place]  [address] Phone: [phone]

## 2024-02-10 DIAGNOSIS — F4320 Adjustment disorder, unspecified: Secondary | ICD-10-CM | POA: Diagnosis not present

## 2024-02-17 DIAGNOSIS — F4389 Other reactions to severe stress: Secondary | ICD-10-CM | POA: Diagnosis not present

## 2024-02-17 DIAGNOSIS — Z630 Problems in relationship with spouse or partner: Secondary | ICD-10-CM | POA: Diagnosis not present

## 2024-02-25 DIAGNOSIS — E785 Hyperlipidemia, unspecified: Secondary | ICD-10-CM | POA: Diagnosis not present

## 2024-02-25 DIAGNOSIS — I10 Essential (primary) hypertension: Secondary | ICD-10-CM | POA: Diagnosis not present

## 2024-02-25 DIAGNOSIS — I471 Supraventricular tachycardia, unspecified: Secondary | ICD-10-CM | POA: Diagnosis not present

## 2024-03-03 DIAGNOSIS — F4389 Other reactions to severe stress: Secondary | ICD-10-CM | POA: Diagnosis not present

## 2024-03-03 DIAGNOSIS — Z630 Problems in relationship with spouse or partner: Secondary | ICD-10-CM | POA: Diagnosis not present

## 2024-03-29 DIAGNOSIS — F4389 Other reactions to severe stress: Secondary | ICD-10-CM | POA: Diagnosis not present

## 2024-03-29 DIAGNOSIS — Z630 Problems in relationship with spouse or partner: Secondary | ICD-10-CM | POA: Diagnosis not present

## 2024-04-23 DIAGNOSIS — Z630 Problems in relationship with spouse or partner: Secondary | ICD-10-CM | POA: Diagnosis not present

## 2024-04-23 DIAGNOSIS — F4389 Other reactions to severe stress: Secondary | ICD-10-CM | POA: Diagnosis not present

## 2024-05-26 DIAGNOSIS — Z630 Problems in relationship with spouse or partner: Secondary | ICD-10-CM | POA: Diagnosis not present

## 2024-05-26 DIAGNOSIS — F4389 Other reactions to severe stress: Secondary | ICD-10-CM | POA: Diagnosis not present

## 2024-06-17 NOTE — Progress Notes (Addendum)
 Virginia [name]                                          MRN: [id number]   08/27/2024   The [facility]BCI Quality Team Specialist reviewed this patient medical record for the purposes of chart review for care gap closure. The following were reviewed: chart review for care gap closure-controlling blood pressure.    VBCI Quality Team

## 2024-06-23 DIAGNOSIS — Z630 Problems in relationship with spouse or partner: Secondary | ICD-10-CM | POA: Diagnosis not present

## 2024-06-23 DIAGNOSIS — F4389 Other reactions to severe stress: Secondary | ICD-10-CM | POA: Diagnosis not present

## 2024-07-14 DIAGNOSIS — Z630 Problems in relationship with spouse or partner: Secondary | ICD-10-CM | POA: Diagnosis not present

## 2024-07-14 DIAGNOSIS — F4389 Other reactions to severe stress: Secondary | ICD-10-CM | POA: Diagnosis not present

## 2024-08-13 DIAGNOSIS — F4389 Other reactions to severe stress: Secondary | ICD-10-CM | POA: Diagnosis not present

## 2024-08-13 DIAGNOSIS — Z630 Problems in relationship with spouse or partner: Secondary | ICD-10-CM | POA: Diagnosis not present

## 2024-12-31 ENCOUNTER — Ambulatory Visit (HOSPITAL_BASED_OUTPATIENT_CLINIC_OR_DEPARTMENT_OTHER): Admitting: Cardiology
